# Patient Record
Sex: Male | Born: 1980
Health system: Southern US, Community
[De-identification: ages and names within clinical notes are randomized; demographics above are authoritative.]

## PROBLEM LIST (undated history)

## (undated) DIAGNOSIS — R0683 Snoring: Secondary | ICD-10-CM

## (undated) DIAGNOSIS — K219 Gastro-esophageal reflux disease without esophagitis: Secondary | ICD-10-CM

## (undated) DIAGNOSIS — I10 Essential (primary) hypertension: Secondary | ICD-10-CM

## (undated) DIAGNOSIS — M6282 Rhabdomyolysis: Secondary | ICD-10-CM

## (undated) DIAGNOSIS — R748 Abnormal levels of other serum enzymes: Secondary | ICD-10-CM

## (undated) HISTORY — PX: CYST EXCISION: SHX5701

## (undated) HISTORY — DX: Abnormal levels of other serum enzymes: R74.8

## (undated) HISTORY — PX: WISDOM TOOTH EXTRACTION: SHX21

## (undated) HISTORY — DX: Rhabdomyolysis: M62.82

## (undated) HISTORY — DX: Snoring: R06.83

## (undated) HISTORY — DX: Essential (primary) hypertension: I10

## (undated) HISTORY — PX: MUSCLE BIOPSY: SHX716

---
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
## 2001-07-26 ENCOUNTER — Encounter: Admission: RE | Admit: 2001-07-26 | Discharge: 2001-07-26 | Payer: Self-pay | Admitting: Family Medicine

## 2001-10-22 ENCOUNTER — Encounter: Admission: RE | Admit: 2001-10-22 | Discharge: 2001-10-22 | Payer: Self-pay | Admitting: Family Medicine

## 2002-02-21 DIAGNOSIS — M6282 Rhabdomyolysis: Secondary | ICD-10-CM

## 2002-02-21 HISTORY — DX: Rhabdomyolysis: M62.82

## 2002-03-19 ENCOUNTER — Encounter: Admission: RE | Admit: 2002-03-19 | Discharge: 2002-03-19 | Payer: Self-pay | Admitting: Family Medicine

## 2002-03-26 ENCOUNTER — Encounter: Admission: RE | Admit: 2002-03-26 | Discharge: 2002-03-26 | Payer: Self-pay | Admitting: Family Medicine

## 2002-04-03 ENCOUNTER — Encounter: Admission: RE | Admit: 2002-04-03 | Discharge: 2002-04-03 | Payer: Self-pay | Admitting: Family Medicine

## 2002-04-10 ENCOUNTER — Encounter: Admission: RE | Admit: 2002-04-10 | Discharge: 2002-04-10 | Payer: Self-pay | Admitting: Family Medicine

## 2004-07-05 ENCOUNTER — Ambulatory Visit: Payer: Self-pay | Admitting: Internal Medicine

## 2004-09-28 ENCOUNTER — Ambulatory Visit: Payer: Self-pay | Admitting: Internal Medicine

## 2004-10-03 ENCOUNTER — Ambulatory Visit: Payer: Self-pay | Admitting: Internal Medicine

## 2004-10-10 ENCOUNTER — Ambulatory Visit: Payer: Self-pay | Admitting: Internal Medicine

## 2005-07-19 ENCOUNTER — Ambulatory Visit: Payer: Self-pay | Admitting: Internal Medicine

## 2006-10-29 ENCOUNTER — Encounter: Payer: Self-pay | Admitting: Internal Medicine

## 2006-10-29 ENCOUNTER — Ambulatory Visit: Payer: Self-pay | Admitting: Internal Medicine

## 2006-10-29 DIAGNOSIS — R945 Abnormal results of liver function studies: Secondary | ICD-10-CM | POA: Insufficient documentation

## 2006-10-29 LAB — CONVERTED CEMR LAB
ALT: 30 units/L (ref 0–40)
Alkaline Phosphatase: 63 units/L (ref 39–117)
BUN: 10 mg/dL (ref 6–23)
Basophils Absolute: 0.2 10*3/uL — ABNORMAL HIGH (ref 0.0–0.1)
Basophils Relative: 2.6 % — ABNORMAL HIGH (ref 0.0–1.0)
CO2: 32 meq/L (ref 19–32)
Calcium: 9.7 mg/dL (ref 8.4–10.5)
Cholesterol: 185 mg/dL (ref 0–200)
Eosinophils Absolute: 0.1 10*3/uL (ref 0.0–0.6)
GFR calc Af Amer: 94 mL/min
GFR calc non Af Amer: 78 mL/min
HDL: 42.9 mg/dL (ref >39.0)
Hemoglobin: 15.1 g/dL (ref 13.0–17.0)
Lymphocytes Relative: 37 % (ref 12.0–46.0)
MCHC: 33.2 g/dL (ref 30.0–36.0)
MCV: 83.7 fL (ref 78.0–100.0)
Monocytes Absolute: 0.3 10*3/uL (ref 0.2–0.7)
Monocytes Relative: 4.4 % (ref 3.0–11.0)
Neutro Abs: 4.4 10*3/uL (ref 1.4–7.7)
Platelets: 249 10*3/uL (ref 150–400)
Potassium: 4 meq/L (ref 3.5–5.1)
TSH: 1.55 microintl units/mL (ref 0.35–5.50)
Total Protein: 7.7 g/dL (ref 6.0–8.3)
Triglycerides: 147 mg/dL (ref 0–149)
VLDL: 29 mg/dL (ref 0–40)

## 2006-11-26 ENCOUNTER — Ambulatory Visit: Payer: Self-pay | Admitting: Internal Medicine

## 2006-11-26 ENCOUNTER — Encounter: Payer: Self-pay | Admitting: Internal Medicine

## 2006-11-26 LAB — CONVERTED CEMR LAB: Total CK: 776 units/L (ref 7–195)

## 2007-01-14 ENCOUNTER — Ambulatory Visit: Payer: Self-pay | Admitting: Internal Medicine

## 2007-01-14 DIAGNOSIS — K648 Other hemorrhoids: Secondary | ICD-10-CM | POA: Insufficient documentation

## 2007-10-14 ENCOUNTER — Ambulatory Visit: Payer: Self-pay | Admitting: Internal Medicine

## 2007-10-28 ENCOUNTER — Ambulatory Visit: Payer: Self-pay | Admitting: Internal Medicine

## 2007-10-30 LAB — CONVERTED CEMR LAB
Calcium: 9.9 mg/dL (ref 8.4–10.5)
Chloride: 99 meq/L (ref 96–112)
Creatinine, Ser: 1.3 mg/dL (ref 0.4–1.5)
GFR calc non Af Amer: 70 mL/min
Sodium: 137 meq/L (ref 135–145)

## 2007-12-11 ENCOUNTER — Encounter (INDEPENDENT_AMBULATORY_CARE_PROVIDER_SITE_OTHER): Payer: Self-pay | Admitting: *Deleted

## 2008-03-16 ENCOUNTER — Encounter (INDEPENDENT_AMBULATORY_CARE_PROVIDER_SITE_OTHER): Payer: Self-pay | Admitting: *Deleted

## 2008-05-28 ENCOUNTER — Ambulatory Visit: Payer: Self-pay | Admitting: Family Medicine

## 2008-05-28 ENCOUNTER — Encounter (INDEPENDENT_AMBULATORY_CARE_PROVIDER_SITE_OTHER): Payer: Self-pay | Admitting: *Deleted

## 2008-05-28 LAB — CONVERTED CEMR LAB
Inflenza A Ag: POSITIVE
Influenza B Ag: POSITIVE

## 2009-08-18 ENCOUNTER — Ambulatory Visit: Payer: Self-pay | Admitting: Family

## 2009-08-23 ENCOUNTER — Ambulatory Visit: Payer: Self-pay | Admitting: Family

## 2009-08-23 LAB — CONVERTED CEMR LAB
ALT: 34 units/L (ref 0–53)
AST: 44 units/L — ABNORMAL HIGH (ref 0–37)
Alkaline Phosphatase: 51 units/L (ref 39–117)
Basophils Absolute: 0 10*3/uL (ref 0.0–0.1)
Bilirubin, Direct: 0 mg/dL (ref 0.0–0.3)
Chloride: 104 meq/L (ref 96–112)
Eosinophils Absolute: 0.1 10*3/uL (ref 0.0–0.7)
LDL Cholesterol: 99 mg/dL (ref 0–99)
Lymphocytes Relative: 39.6 % (ref 12.0–46.0)
MCHC: 33.1 g/dL (ref 30.0–36.0)
Monocytes Relative: 8.7 % (ref 3.0–12.0)
Neutrophils Relative %: 50.3 % (ref 43.0–77.0)
Potassium: 3.9 meq/L (ref 3.5–5.1)
RBC: 5.09 M/uL (ref 4.22–5.81)
RDW: 13.1 % (ref 11.5–14.6)
Sodium: 141 meq/L (ref 135–145)
Total Bilirubin: 0.6 mg/dL (ref 0.3–1.2)
Total CHOL/HDL Ratio: 4
Triglycerides: 153 mg/dL — ABNORMAL HIGH (ref 0.0–149.0)

## 2009-08-27 ENCOUNTER — Encounter: Payer: Self-pay | Admitting: Family

## 2009-09-15 ENCOUNTER — Ambulatory Visit: Payer: Self-pay | Admitting: Internal Medicine

## 2009-10-28 ENCOUNTER — Telehealth (INDEPENDENT_AMBULATORY_CARE_PROVIDER_SITE_OTHER): Payer: Self-pay | Admitting: *Deleted

## 2010-02-07 ENCOUNTER — Ambulatory Visit: Payer: Self-pay | Admitting: Internal Medicine

## 2010-02-08 LAB — CONVERTED CEMR LAB
Chloride: 105 meq/L (ref 96–112)
Creatinine, Ser: 1.3 mg/dL (ref 0.4–1.5)
Potassium: 4.4 meq/L (ref 3.5–5.1)
Sodium: 140 meq/L (ref 135–145)

## 2010-03-11 ENCOUNTER — Ambulatory Visit: Payer: Self-pay | Admitting: Pulmonary Disease

## 2010-03-11 DIAGNOSIS — R0683 Snoring: Secondary | ICD-10-CM | POA: Insufficient documentation

## 2010-04-10 ENCOUNTER — Encounter: Payer: Self-pay | Admitting: Pulmonary Disease

## 2010-04-10 ENCOUNTER — Ambulatory Visit (HOSPITAL_BASED_OUTPATIENT_CLINIC_OR_DEPARTMENT_OTHER): Admission: RE | Admit: 2010-04-10 | Discharge: 2010-04-10 | Payer: Self-pay | Admitting: Pulmonary Disease

## 2010-04-22 ENCOUNTER — Ambulatory Visit: Payer: Self-pay | Admitting: Pulmonary Disease

## 2010-08-23 ENCOUNTER — Ambulatory Visit
Admission: RE | Admit: 2010-08-23 | Discharge: 2010-08-23 | Payer: Self-pay | Source: Home / Self Care | Attending: Internal Medicine | Admitting: Internal Medicine

## 2010-08-23 ENCOUNTER — Encounter: Payer: Self-pay | Admitting: Internal Medicine

## 2010-08-23 NOTE — Miscellaneous (Signed)
Summary: npsg with AHI only 2.5/hr  Clinical Lists Changes  pt's npsg shows no sleep disordered breathing.  will call to inform him of results.  Appended Document: npsg with AHI only 2.5/hr please call pt and let him know that his sleep study does not show sleep apnea.  He does however need to work on weight loss or this can get worse.  Appended Document: npsg with AHI only 2.5/hr atc pt but no vm to leave a message. will attempt to call back later  Appended Document: npsg with AHI only 2.5/hr atc x2 no vm to leave message will try back again later  Appended Document: npsg with AHI only 2.5/hr called and spoke with pt and informed him with the results of his sleep study. Pt verbally stated he understood

## 2010-08-23 NOTE — Assessment & Plan Note (Signed)
Summary: RIGHT EAR PAIN//PH   Vital Signs:  Patient profile:   30 year old male Height:      70.25 inches Weight:      267 pounds BMI:     38.18 Temp:     98.7 degrees F oral BP sitting:   140 / 80  (left arm)  Vitals Entered By: Doristine Devoid (August 18, 2009 8:09 AM) CC: R ear discomfort    CC:  R ear discomfort .  History of Present Illness: [name] is a 30 year old male who presents today with complaint of R ear pain and difficulty hearing out of his right ear x 3 weeks.  Denies fever,  has used ear wax softening drops only.   Allergies: No Known Drug Allergies  Review of Systems       denies drainage from ear, denies fever.    Physical Exam  General:  Well-developed,well-nourished,in no acute distress; alert,appropriate and cooperative throughout examination Head:  Normocephalic and atraumatic without obvious abnormalities. No apparent alopecia or balding. Eyes:  PERRLA Ears:  Bilateral ear canals with impacted cerumen.  Cerumen removed with currette and flushing.  Bilateral TM's + erythema bulging right TM greater than the left.   Mouth:  Oral mucosa and oropharynx without lesions or exudates.  Teeth in good repair. Neck:  No deformities, masses, or tenderness noted. Lungs:  Normal respiratory effort, chest expands symmetrically. Lungs are clear to auscultation, no crackles or wheezes. Heart:  Normal rate and regular rhythm. S1 and S2 normal without gallop, murmur, click, rub or other extra sounds.   Impression & Recommendations:  Problem # 1:  OTITIS MEDIA, BILATERAL (ICD-382.9) Assessment New will place on amoxicillin His updated medication list for this problem includes:    Amoxicillin 500 Mg Cap (Amoxicillin) .Marland Kitchen... Take 1 capsule by mouth three times a day x 10 days  Problem # 2:  HYPERTENSION (ICD-401.9) Assessment: Deteriorated Notes non-compliant with meds, "thought my pressure was getting better"  Patient instructed to resume meds, arrange fasting  blood work and complete physical in the next 1-2 weeks.   His updated medication list for this problem includes:    Maxzide-25 37.5-25 Mg Tabs (Triamterene-hctz) .Marland Kitchen... 1/2 by mouth once daily  BP today: 140/80 Prior BP: 124/80 (05/28/2008)  Labs Reviewed: K+: 4.7 (10/28/2007) Creat: : 1.3 (10/28/2007)   Chol: 185 (10/29/2006)   HDL: 42.9 (10/29/2006)   LDL: 113 (10/29/2006)   TG: 147 (10/29/2006)  Problem # 3:  CERUMEN IMPACTION, BILATERAL (ICD-380.4) Assessment: New  S/P disimpaction with currette and flushing.  Orders: Cerumen Impaction Removal ([address])  Complete Medication List: 1)  Maxzide-25 37.5-25 Mg Tabs (Triamterene-hctz) .... 1/2 by mouth once daily 2)  Amoxicillin 500 Mg Cap (Amoxicillin) .... Take 1 capsule by mouth three times a day x 10 days  Patient Instructions: 1)  Please return next week fasting for the following labs- 2)  CBC, BMET, LFT's (v70) 3)  Complete antibiotics- call if your symptoms worsen or do not improve. 4)  Start taking your blood pressure medicines. 5)  Arrange a physical appointment in  1-2 weeks. Prescriptions: MAXZIDE-25 37.5-25 MG  TABS (TRIAMTERENE-HCTZ) 1/2 by mouth once daily  #30 Tablet x 0   Entered and Authorized by:   Lemont Fillers FNP   Signed by:   Lemont Fillers FNP on 08/18/2009   Method used:   [facility]        [facility] [phone][facility] (retail)  [address]       [place], [place]  [address]       Ph: [phone]       Fax: [phone]   RxID:   [id number] AMOXICILLIN 500 MG CAP (AMOXICILLIN) Take 1 capsule by mouth three times a day X 10 days  #30 x 0   Entered and Authorized by:   Lemont Fillers FNP   Signed by:   Lemont Fillers FNP on 08/18/2009   Method used:   [facility] to        [facility] [phone]* (retail)       [address]       [place], [place]  [address]       Ph: [phone]       Fax: [phone]    RxID:   [id number]

## 2010-08-23 NOTE — Assessment & Plan Note (Signed)
Summary: CPX/NS/KDC   Vital Signs:  Patient profile:   30 year old male Height:      70.25 inches Weight:      267.6 pounds BMI:     38.26 Pulse rate:   88 / minute BP sitting:   120 / 90  Vitals Entered By: Shary Decamp (September 15, 2009 1:39 PM) CC: cpx - not fasting Is Patient Diabetic? No   History of Present Illness: CPX  restarted BP meds last month, good medication compliance, no ambulatory BPs except x 1 time: 120/?  Preventive Screening-Counseling & Management  Alcohol-Tobacco     Alcohol type: weekends only  Caffeine-Diet-Exercise     Caffeine use/day: 1     Does Patient Exercise: no      Drug Use:  no.    Current Medications (verified): 1)  Maxzide-25 37.5-25 Mg  Tabs (Triamterene-Hctz) .... 1/2 By Mouth Once Daily  Allergies (verified): No Known Drug Allergies  Past History:  Past Medical History: Episode of rhabdomyolysis 8/03 (ck=20,000)-unclear, etiology Hypertension, dx aprox 2005   Past Surgical History: no  Family History: Reviewed history from 10/29/2006 and no changes required. MI-- F age 73 DM--no hypertension-- F M No history of colon or prostate cancer.  Social History: Reviewed history from 10/29/2006 and no changes required. 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 CV:  Denies chest pain or discomfort and swelling of feet. Resp:  Denies cough and shortness of breath. GI:  Denies bloody stools, diarrhea, nausea, and vomiting. GU:  Denies dysuria, hematuria, urinary frequency, and urinary hesitancy. Psych:  Denies anxiety and depression.  Physical Exam  General:  alert, well-developed, and overweight-appearing.   Neck:  no masses and no thyromegaly.   Lungs:  normal respiratory effort,  no intercostal retractions, no accessory muscle use, and normal breath sounds.   Heart:  normal rate, regular rhythm, no murmur, and no gallop.   Abdomen:  soft, non-tender, no distention, no masses, no guarding, and no rigidity.   Extremities:  no pretibial edema bilaterally  Psych:  Cognition and judgment appear intact. Alert and cooperative with normal attention span and concentration.  not anxious appearing and not depressed appearing.     Impression & Recommendations:  Problem # 1:  HEALTH MAINTENANCE EXAM (ICD-V70.0) Td 08 BMI discussed, diet! exercise! counseled  recent labs reviewed  Problem # 2:  HYPERTENSION (ICD-401.9) well controlled? see instructions  continue same meds  His updated medication list for this problem includes:    Maxzide-25 37.5-25 Mg Tabs (Triamterene-hctz) .Marland Kitchen... 1/2 by mouth once daily  BP today: 120/90 Prior BP: 140/80 (08/18/2009)  Labs Reviewed: K+: 3.9 (08/23/2009) Creat: : 1.4 (08/23/2009)   Chol: 172 (08/23/2009)   HDL: 42.10 (08/23/2009)   LDL: 99 (08/23/2009)   TG: 153.0 (08/23/2009)  Complete Medication List: 1)  Maxzide-25 37.5-25 Mg Tabs (Triamterene-hctz) .... 1/2 by mouth once daily  Patient Instructions: 1)  Check your blood pressure 2 or 3 times a week. If it is more than 140/80  consistently,please let us know  2)  Please schedule a follow-up appointment in 6 months .    Preventive Care Screening  Prior Values:    Last Tetanus Booster:  Tdap (10/29/2006)    Past Medical History:  Episode of rhabdomyolysis 8/03 (ck=20,000)-unclear, etiology    Hypertension, dx aprox 2005   Past Surgical History:    no    Risk Factors:  Tobacco use:  current    Year started:  smokes a cigar very seldom    Cigars:  Yes Drug use:  no Caffeine use:  1 drinks per day Alcohol use:  yes    Type:  weekends only Exercise:  no

## 2010-08-23 NOTE — Letter (Signed)
   [facility] [address] [place], [place] [address] [phone]    August 27, 2009   [facility] [address] [place], [place] [address]  RE:  LAB RESULTS  Dear  Mr. Quinto,  The following is an interpretation of your most recent lab tests.  Please take note of any instructions provided or changes to medications that have resulted from your lab work.  ELECTROLYTES:  Good - no changes needed  KIDNEY FUNCTION TESTS:  Good - no changes needed  LIPID PANEL:  Stable - no changes needed Triglyceride: 153.0   Cholesterol: 172   LDL: 99   HDL: 42.10   Chol/HDL%:  4   CBC:  Good - no changes needed   Sincerely Yours,    Lemont Fillers FNP

## 2010-08-23 NOTE — Assessment & Plan Note (Signed)
Summary: consult for possible osa   Copy to:  [facility] Primary Provider/Referring Provider:  Nolon Rod. Paz MD  CC:  Sleep Consult.  History of Present Illness: The pt is a 30y/o male who I have been asked to see for possible osa.  He has been noted to have loud snoring and an abnormal breathing pattern during sleep by his wife, and also describes choking arousals on rare occasions.  He goes to bed btw 11-12am, and arises at 7-8am to start his day.  He feels that he is rested upon arising.  The pt works as a Naval architect, and denies sleep pressure with periods of inactivity except with meetings.  He relates that he may doze in the evening with tv or movies, but denies any issues with sleepiness while driving.  His weight is up 10 pounds over the last 2 years, and his epworth score is 4.  Medications Prior to Update: 1)  Maxzide-25 37.5-25 Mg  Tabs (Triamterene-Hctz) .... 1/2 By Mouth Once Daily  Allergies (verified): No Known Drug Allergies  Past History:  Past Medical History: Reviewed history from 09/15/2009 and no changes required. Episode of rhabdomyolysis 8/03 (ck=20,000)-unclear, etiology Hypertension, dx aprox 2005   Past Surgical History: wisdom teeth extracted  Family History: Reviewed history from 09/15/2009 and no changes required. MI-- F age 53 DM--no hypertension-- F M No history of colon or prostate cancer.  Social History: Reviewed history from 09/15/2009 and no changes required. 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  ETOH-- weekends  diet-- "the best i can", on the road a lot  exercise-- once in a while   Review of Systems       The patient complains of shortness of breath with activity, shortness of breath at rest, acid heartburn, and headaches.  The patient denies productive cough, non-productive cough, coughing up blood, chest pain, irregular heartbeats, indigestion, loss of appetite, weight  change, abdominal pain, difficulty swallowing, sore throat, tooth/dental problems, nasal congestion/difficulty breathing through nose, sneezing, itching, ear ache, anxiety, depression, hand/feet swelling, joint stiffness or pain, rash, change in color of mucus, and fever.    Vital Signs:  Patient profile:   30 year old male Height:      70.25 inches Weight:      267.38 pounds BMI:     38.23 O2 Sat:      98 % on Room air Temp:     98.2 degrees F oral Pulse rate:   89 / minute BP sitting:   142 / 78  (left arm) Cuff size:   large  Vitals Entered By: Arman Filter LPN (March 11, 2010 11:26 AM)  O2 Flow:  Room air CC: Sleep Consult Comments Medications reviewed with patient Arman Filter LPN  March 11, 2010 11:32 AM    Physical Exam  General:  ow male in nad Eyes:  PERRLA and EOMI.   Nose:  turbinate hypertrophy bilat, mild septal deviation to left. Mouth:  mild elongation of soft palate and uvula significant narrowing posterior airway secondary to tonsillar hypertrophy and side wall tissue redundancy. Neck:  no jvd, tmg, LN Lungs:  clear to auscultation Heart:  rrr, no mrg Abdomen:  soft and nontender, bs+ Extremities:  no edema noted, pulses intact distally no cyanosis  Neurologic:  alert and oriented, moves all 4.   Impression & Recommendations:  Problem # 1:  SNORING (ICD-786.09) the pt's history is classic for sleep disordered breathing, but it  is unclear whether he has sleep apnea or just symptomatic snoring/upper airway resistance syndrome.  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  He also has underlying htn.  I would recommend a sleep study at the sleep center for documentation given his high risk occupation, and the requirement for DOT clearance.  I have also had a long discussion with the pt about sleep apnea, including its impact on QOL and CV health.  He will followup with me once results are available.  Other  Orders: Consultation Level IV (04540) Sleep Disorder Referral (Sleep Disorder)  Patient Instructions: 1)  will schedule for sleep study, and arrange followup once results are available. 2)  work on weight loss.

## 2010-08-23 NOTE — Assessment & Plan Note (Signed)
Summary: snoring/cbs   Vital Signs:  Patient profile:   30 year old male Weight:      264.25 pounds BMI:     37.78 Pulse rate:   86 / minute Pulse rhythm:   regular BP sitting:   136 / 80  (left arm) Cuff size:   large  Vitals Entered By: Army Fossa CMA (February 07, 2010 8:41 AM) CC: Pt here to discuss snoring problems. referral?   History of Present Illness: CC : snoring  loud snoring   x 6 months  sometimes wakes  up "choking", ?stop breathing at night   Allergies (verified): No Known Drug Allergies  Past History:  Past Medical History: Reviewed history from 09/15/2009 and no changes required. Episode of rhabdomyolysis 8/03 (ck=20,000)-unclear, etiology Hypertension, dx aprox 2005   Past Surgical History: Reviewed history from 09/15/2009 and no changes required. no  Social History: Reviewed history from 09/15/2009 and no changes required. 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   Mouth:  oropharynx moderately crowded Neck:  large neck Lungs:  normal respiratory effort, no intercostal retractions, no accessory muscle use, and normal breath sounds.   Heart:  normal rate, regular rhythm, no murmur, and no gallop.   Extremities:  no lower extremity edema   Impression & Recommendations:  Problem # 1:  ? 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 1/2 by mouth once daily  Orders: Venipuncture (84696) TLB-BMP (Basic Metabolic Panel-BMET) (80048-METABOL) Specimen Handling (29528)  BP today: 136/80 Prior BP: 120/90 (09/15/2009)  Labs Reviewed: K+: 3.9 (08/23/2009) Creat: : 1.4 (08/23/2009)   Chol: 172 (08/23/2009)   HDL: 42.10 (08/23/2009)   LDL: 99 (08/23/2009)   TG: 153.0 (08/23/2009)  Complete Medication List: 1)  Maxzide-25 37.5-25 Mg Tabs (Triamterene-hctz) .... 1/2 by mouth once daily  Patient Instructions: 1)  Will refer to to the doctors that rules out the sleep apnea 2)  Please schedule a follow-up appointment in 6 months , fasting, physical exam

## 2010-08-23 NOTE — Progress Notes (Signed)
  Phone Note Refill Request Message from:  Patient  Refills Requested: Medication #1:  MAXZIDE-25 37.5-25 MG  TABS 1/2 by mouth once daily. [facility]  Initial call taken by: Kandice Hams,  October 28, 2009 4:18 PM    Prescriptions: MAXZIDE-25 37.5-25 MG  TABS (TRIAMTERENE-HCTZ) 1/2 by mouth once daily  #30 Tablet x 2   Entered by:   Kandice Hams   Authorized by:   Nolon Rod. Paz MD   Signed by:   Kandice Hams on 10/28/2009   Method used:   Faxed to ...       [facility] [phone][facility] (retail)       [address]       [place], [place]  [address]       Ph: [phone]       Fax: [phone]   RxID:   [id number]

## 2010-08-31 NOTE — Letter (Signed)
Summary: Out of Work  [facility] [employer]  [address] [place], [place] [address]   Phone: [phone]  Fax: [phone]    August 23, 2010   Employee:  RINO HOSEA    To Whom It May Concern:   For Medical reasons, please excuse the above named employee from work for the following dates:  Start:   08/23/10  End:   08/25/10  The patient may return to work on 08/25/2010. If you need additional information, please feel free to contact our office.         Sincerely,    Willow Ora, MD

## 2010-08-31 NOTE — Assessment & Plan Note (Signed)
Summary: congested/cbs   Vital Signs:  Patient profile:   30 year old male Height:      70.25 inches Weight:      261 pounds O2 Sat:      96 % on Room air Temp:     98.8 degrees F oral Pulse rate:   109 / minute BP sitting:   118 / 72  (left arm)  Vitals Entered By: Jeremy Johann CMA (August 23, 2010 12:46 PM)  O2 Flow:  Room air CC: cough Comments -chest                     -pain                    -congestion -headache   History of Present Illness: symptoms started 3 days ago A low cough which is persistent and "violent" lately associated with anterior sharp chest pain. He will be asked to see in sputum early in the mornings otherwise the cough is dry. The sputum is green, this morning he saw you streaks of blood.  Current Medications (verified): 1)  Maxzide-25 37.5-25 Mg  Tabs (Triamterene-Hctz) .... 1/2 By Mouth Once Daily  Allergies (verified): No Known Drug Allergies  Past History:  Past Medical History: Reviewed history from 09/15/2009 and no changes required. Episode of rhabdomyolysis 8/03 (ck=20,000)-unclear, etiology Hypertension, dx aprox 2005   Past Surgical History: Reviewed history from 03/11/2010 and no changes required. wisdom teeth extracted  Social History: Reviewed history from 03/11/2010 and no changes required. 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  ETOH-- weekends  diet-- "the best i can", on the road a lot  exercise-- once in a while   Review of Systems General:  Denies chills and fever. ENT:  Denies sore throat; sinus congestion (-) . GI:  Denies nausea and vomiting; some diarrhea yesterday. MS:  Denies muscle aches.  Physical Exam  General:  alert and well-developed.  non-toxic Head:  face symmetric, nontender to palpation Ears:  R ear normal and L ear normal.   Nose:  not congested Mouth:  no redness or discharge Lungs:  normal respiratory effort, no  intercostal retractions, no accessory muscle use, and normal breath sounds.   Heart:  normal rate, regular rhythm, and no murmur.  I personally recheck his heart rate---- 100 Extremities:  no edema    Impression & Recommendations:  Problem # 1:  COUGH (ICD-786.2)  cough for 3 days with green sputum, no sinus symptoms, mild hemoptysis this morning? he is slightly tachycardic but not toxic Plan: Chest x-ray Antibiotics to be prescribed with results See  instructions  Orders: T-2 View CXR (71020TC)  Complete Medication List: 1)  Maxzide-25 37.5-25 Mg Tabs (Triamterene-hctz) .... 1/2 by mouth once daily 2)  Hydrocodone-homatropine 5-1.5 Mg Tabs (Hydrocodone-homatropine) .... One tsp every 4 hours as needed for cough  Patient Instructions: 1)  please get your chest x-ray 2)  Rest, fluids, Tylenol 3)  Mucinex DM twice a day until cough better 4)  is the cough is persistent or severe, you can use hydrocodone. it will make you  sleepy 5)  We'll call in antibiotics as soon as we get the chest x-ray report Prescriptions: HYDROCODONE-HOMATROPINE 5-1.5 MG TABS (HYDROCODONE-HOMATROPINE) one tsp every 4 hours as needed for cough  #150cc x 0   Entered and Authorized by:   Nolon Rod. Kerly Rigsbee MD   Signed by:   Nolon Rod. Benaiah Behan MD  on 08/23/2010   Method used:   Print then Give to Patient   RxID:   [id number]    Orders Added: 1)  T-2 View CXR [71020TC] 2)  Est. Patient Level III [56213]

## 2010-09-19 ENCOUNTER — Other Ambulatory Visit: Payer: Self-pay | Admitting: Internal Medicine

## 2010-09-19 ENCOUNTER — Encounter (INDEPENDENT_AMBULATORY_CARE_PROVIDER_SITE_OTHER): Payer: Managed Care, Other (non HMO) | Admitting: Internal Medicine

## 2010-09-19 ENCOUNTER — Encounter: Payer: Self-pay | Admitting: Internal Medicine

## 2010-09-19 DIAGNOSIS — Z0000 Encounter for general adult medical examination without abnormal findings: Secondary | ICD-10-CM

## 2010-09-19 DIAGNOSIS — IMO0001 Reserved for inherently not codable concepts without codable children: Secondary | ICD-10-CM | POA: Insufficient documentation

## 2010-09-19 DIAGNOSIS — L723 Sebaceous cyst: Secondary | ICD-10-CM | POA: Insufficient documentation

## 2010-09-19 LAB — BASIC METABOLIC PANEL
CO2: 29 mEq/L (ref 19–32)
Chloride: 103 mEq/L (ref 96–112)
Creatinine, Ser: 1.1 mg/dL (ref 0.4–1.5)
Potassium: 4.3 mEq/L (ref 3.5–5.1)
Sodium: 139 mEq/L (ref 135–145)

## 2010-09-19 LAB — CBC WITH DIFFERENTIAL/PLATELET
Basophils Relative: 0.3 % (ref 0.0–3.0)
Eosinophils Absolute: 0.1 10*3/uL (ref 0.0–0.7)
Eosinophils Relative: 1.3 % (ref 0.0–5.0)
HCT: 41.8 % (ref 39.0–52.0)
Hemoglobin: 14.2 g/dL (ref 13.0–17.0)
Lymphs Abs: 3.2 10*3/uL (ref 0.7–4.0)
MCHC: 34 g/dL (ref 30.0–36.0)
MCV: 85 fl (ref 78.0–100.0)
Monocytes Absolute: 0.5 10*3/uL (ref 0.1–1.0)
Neutro Abs: 4.2 10*3/uL (ref 1.4–7.7)
Neutrophils Relative %: 52.2 % (ref 43.0–77.0)
RBC: 4.92 Mil/uL (ref 4.22–5.81)
WBC: 8 10*3/uL (ref 4.5–10.5)

## 2010-09-19 LAB — LIPID PANEL
LDL Cholesterol: 84 mg/dL (ref 0–99)
Total CHOL/HDL Ratio: 4
Triglycerides: 169 mg/dL — ABNORMAL HIGH (ref 0.0–149.0)

## 2010-09-19 LAB — TSH: TSH: 1.26 u[IU]/mL (ref 0.35–5.50)

## 2010-09-19 LAB — ALT: ALT: 42 U/L (ref 0–53)

## 2010-09-19 LAB — AST: AST: 37 U/L (ref 0–37)

## 2010-09-22 ENCOUNTER — Encounter: Payer: Self-pay | Admitting: Internal Medicine

## 2010-09-29 NOTE — Assessment & Plan Note (Signed)
Summary: CPX/LAB/CBS/PH   Vital Signs:  Patient profile:   30 year old male Height:      70.25 inches Weight:      262.25 pounds Pulse rate:   78 / minute Pulse rhythm:   regular BP sitting:   126 / 82  (left arm) Cuff size:   large  Vitals Entered By: Army Fossa CMA (September 19, 2010 8:28 AM) CC: CPX, fasting  Comments c/o pain in (L) knee during activity [facility] [address]   History of Present Illness:  complete physical exam 4 days h/o L knee pain, no swelling, redness or specific injury, pain is anterior  occasionally has dyscomfort at a cyst in the back  Current Medications (verified): 1)  Maxzide-25 37.5-25 Mg  Tabs (Triamterene-Hctz) .... 1/2 By Mouth Once Daily  Allergies (verified): No Known Drug Allergies  Past History:  Past Medical History: Episode of rhabdomyolysis 8/03 (ck=20,000)-unclear, etiology Hypertension, dx aprox 2005  snoring--- sleep study  03-2010---->  negative for sleep apnea, Rx wt loss   Past Surgical History: Reviewed history from 03/11/2010 and no changes required. wisdom teeth extracted  Family History: Reviewed history from 09/15/2009 and no changes required. MI-- F age 13 DM--no hypertension-- F M No history of colon or prostate cancer.  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 nonsmoker @ present  ETOH-- weekends  diet-- avoiding fast food but still eats high in fat  exercise--very active t work  Review of Systems General:  Denies fatigue and fever. CV:  Denies chest pain or discomfort and swelling of feet. Resp:  Denies cough and shortness of breath. GI:  Denies bloody stools, diarrhea, and nausea. GU:  Denies hematuria, urinary frequency, and urinary hesitancy.  Physical Exam  General:  alert, well-developed, and overweight-appearing.   Neck:  no masses and no thyromegaly.   Lungs:  normal respiratory effort, no intercostal retractions,  no accessory muscle use, and normal breath sounds.   Heart:  normal rate, regular rhythm, and no murmur.   Abdomen:  soft, non-tender, no distention, no masses, no guarding, and no rigidity.   Extremities:  no pretibial edema bilaterally Knees symetric, normal to inpection, palpation and passive ROM except for high ryding knee cap Skin:  has a 4cm mass at mid upper back, half of it is fluctuant w/o redness or warmness  Psych:  Cognition and judgment appear intact. Alert and cooperative with normal attention span and concentration. No apparent delusions, illusions, hallucinations   Impression & Recommendations:  Problem # 1:  HEALTH MAINTENANCE EXAM (ICD-V70.0) Td 08 wt discussed , diet! exercise! counseled  labs  STE  Orders: TLB-BMP (Basic Metabolic Panel-BMET) (80048-METABOL) TLB-CBC Platelet - w/Differential (85025-CBCD) TLB-Lipid Panel (80061-LIPID) TLB-TSH (Thyroid Stimulating Hormone) (84443-TSH) TLB-ALT (SGPT) (84460-ALT) TLB-AST (SGOT) (84450-SGOT) Specimen Handling (04540)  Problem # 2:  SEBACEOUS CYST (ICD-706.2)   see physical exam, surgical referal   Orders: Surgical Referral (Surgery)  Problem # 3:   knee pain  exam essentially negative, he does have a high riding kneecap.   Rx  stretching  , if no  better will refer to orthopedic surgery  Problem # 4:  MYALGIA (ICD-729.1)   history of elevated CKs before. Labs  Orders: TLB-Sedimentation Rate (ESR) (85652-ESR) TLB-CK Total Only(Creatine Kinase/CPK) (82550-CK)  Complete Medication List: 1)  Maxzide-25 37.5-25 Mg Tabs (Triamterene-hctz) .... 1/2 by mouth once daily  Other Orders: Venipuncture (98119)  Patient Instructions: 1)  Please schedule a follow-up  appointment in 6 months .  Prescriptions: MAXZIDE-25 37.5-25 MG  TABS (TRIAMTERENE-HCTZ) 1/2 by mouth once daily  #30 x 6   Entered by:   Army Fossa CMA   Authorized by:   Nolon Rod. Jaimee Corum MD   Signed by:   Army Fossa CMA on 09/19/2010    Method used:   [facility] to        [phone] (retail)       [address]       [place], [place]  [address]       Ph: [phone]       Fax: [phone]   RxID:   [id number]    Orders Added: 1)  Venipuncture [72536] 2)  TLB-BMP (Basic Metabolic Panel-BMET) [80048-METABOL] 3)  TLB-CBC Platelet - w/Differential [85025-CBCD] 4)  TLB-Lipid Panel [80061-LIPID] 5)  TLB-TSH (Thyroid Stimulating Hormone) [84443-TSH] 6)  TLB-Sedimentation Rate (ESR) [85652-ESR] 7)  TLB-ALT (SGPT) [84460-ALT] 8)  TLB-AST (SGOT) [84450-SGOT] 9)  TLB-CK Total Only(Creatine Kinase/CPK) [82550-CK] 10)  Specimen Handling [99000] 11)  Surgical Referral [Surgery] 12)  Est. Patient age 2-39 [phone]

## 2010-09-29 NOTE — Letter (Signed)
Summary: neurology referal  [facility] [place]  [address] [place], [place] [address]   Phone: [phone]  Fax: [phone]    09/22/2010 Neurology Thank you in advance for agreeing to see my patient:  Benjamin Warner [address], [place]  [address]  Phone: [phone]  Reason for Referral:  30 y/o male w/ h/o rhabdomyolisis (see below) , currently asx found to have chronically elevated CK. Please advise if further w/u is needed    Current Medications: 1)  MAXZIDE-25 37.5-25 MG  TABS (TRIAMTERENE-HCTZ) 1/2 by mouth once daily   Past Medical History: 1)  Episode of rhabdomyolysis 8/03 (ck=20,000)-unclear, etiology 2)  Hypertension, dx aprox 2005  3)  snoring--- sleep study  03-2010---->  negative for sleep apnea, Rx wt loss       Pertinent Labs: will enclose all recent labs    Thank you again for agreeing to see our patient; please contact us if you have any further questions or need additional information.  Sincerely,  Jose E. Paz MD

## 2010-10-04 ENCOUNTER — Encounter (HOSPITAL_BASED_OUTPATIENT_CLINIC_OR_DEPARTMENT_OTHER)
Admission: RE | Admit: 2010-10-04 | Discharge: 2010-10-04 | Disposition: A | Discharge status: Home / Self Care | Payer: Managed Care, Other (non HMO) | Source: Ambulatory Visit | Attending: General Surgery | Admitting: General Surgery

## 2010-10-04 DIAGNOSIS — Z01810 Encounter for preprocedural cardiovascular examination: Secondary | ICD-10-CM | POA: Insufficient documentation

## 2010-10-04 DIAGNOSIS — Z01812 Encounter for preprocedural laboratory examination: Secondary | ICD-10-CM | POA: Insufficient documentation

## 2010-10-04 LAB — BASIC METABOLIC PANEL
BUN: 12 mg/dL (ref 6–23)
CO2: 29 mEq/L (ref 19–32)
Calcium: 9.6 mg/dL (ref 8.4–10.5)
Glucose, Bld: 94 mg/dL (ref 70–99)
Sodium: 137 mEq/L (ref 135–145)

## 2010-10-06 ENCOUNTER — Ambulatory Visit (HOSPITAL_BASED_OUTPATIENT_CLINIC_OR_DEPARTMENT_OTHER)
Admission: RE | Admit: 2010-10-06 | Discharge: 2010-10-06 | Disposition: A | Discharge status: Home / Self Care | Payer: Managed Care, Other (non HMO) | Source: Ambulatory Visit | Attending: General Surgery | Admitting: General Surgery

## 2010-10-06 DIAGNOSIS — L723 Sebaceous cyst: Secondary | ICD-10-CM | POA: Insufficient documentation

## 2010-10-06 DIAGNOSIS — Z01812 Encounter for preprocedural laboratory examination: Secondary | ICD-10-CM | POA: Insufficient documentation

## 2010-10-06 LAB — POCT HEMOGLOBIN-HEMACUE: Hemoglobin: 15.4 g/dL (ref 13.0–17.0)

## 2010-10-27 NOTE — Op Note (Signed)
NAMEAMEDEO, DETWEILER NO.:  [id number]  MEDICAL RECORD NO.:  [id number]           PATIENT TYPE:  LOCATION:                                 FACILITY:  PHYSICIAN:  Mary Sella. Andrey Campanile, MD     DATE OF BIRTH:  1980/08/03  DATE OF PROCEDURE:  10/06/2010 DATE OF DISCHARGE:                              OPERATIVE REPORT   PREOPERATIVE DIAGNOSIS:  Right upper back and left lower back epidermoid inclusion cyst.  POSTOPERATIVE DIAGNOSIS:  Right upper back and left lower back epidermoid inclusion cyst.  PROCEDURE:  Excision of right upper back and left lower back epidermoid inclusion cyst.  SURGEON:  Mary Sella. Andrey Campanile, MD  ANESTHESIA:  Monitored anesthesia care plus 40 mL of 0.25% Marcaine with epi.  FINDINGS:  The left lower back epidermoid inclusion cyst was about 1 cm x 1 cm.  That wound was closed in 2 layers.  The right upper back epidermoid inclusion cyst was much larger and measured 6 x 5 cm grossly. I made a 6-cm overlying incision and that wound was closed in 1 layer with nylon.  ESTIMATED BLOOD LOSS:  Minimal.  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  He desires surgical excision.  We discussed the risks and benefits of surgery including bleeding, infection, injury to surrounding structure, hematoma formation, seroma formation, wound complications, DVT occurrence, and possible cyst recurrence.  He elects to proceed to surgery.  DESCRIPTION OF PROCEDURE:  After obtaining informed consent, the patient was brought back to the operating room and placed supine on the operating room table.  Monitored anesthesia care was established.  He received antibiotics prior to skin incision.  This was done at [facility].  Sequential  compression devices were placed. He was then placed in the right lateral decubitus position with his left side up.  His back was prepped and draped with ChloraPrep.  I first began with the left lower back epidermoid inclusion cyst.  It was in his left lower back and measured about 1 cm x 1 cm.  Local was infiltrated. I then made an elliptical incision incorporating the punctum.  Then using tenotomy scissors the cyst in its entirety including the wall was excised.  It was about the size of an English P.  The wound was irrigated.  I then placed a 3-0 Vicryl in the deep dermis and then closed the skin with a 4-0 Monocryl in a subcuticular fashion.  I then turned my attention to the right upper back lesion.  It was more of an oblique orientation in the shape of a dumbbell slightly to the right of the spine in the low thoracic region.  Using a pen, I outlined the shape of the cyst which was again in the shape of a dumbbell.  I then drew an oblique incision overlying the midportion incorporating the punctum.  I then infiltrated local in  the regional fashion.  I then incised the skin with a #15 blade and 6 cm in length.  I then divided the deep dermis with Bovie electrocautery using the cut feature.  Then using skin hooks, I raised skin flaps away from the cyst wall.  The cyst wall was entered with some extrusion of the sevum.  The cyst was actually quite deep and went down to the fascia.  I circumferentially dissected this cyst in its entirety away from the surrounding tissue.  This was done in a circumferential pattern.  I ended up incising the cyst in its entirety. It left somewhat of a pocket.  The wound was copiously irrigated. Hemostasis was achieved.  I infiltrated local in the fascia.  I then using electrocautery separated some of the deep fat away from the fascia both on the superior and inferior aspects of wound to help skin closure. I ended up just closing this incision with  interrupted 3-0 nylon sutures approximately 8 stitches.  We then placed antibiotic ointment and 4 x 4 over the left lower back wound.  We placed benzoin, Steri-Strips, and occlusive bandage.  The patient tolerated the procedure well.  There were no immediate complications.  The patient was taken to the recovery room in stable addition.  All needle, instrument, and sponge counts were correct x2.     Mary Sella. Andrey Campanile, MD     EMW/MEDQ  D:  10/06/2010  T:  10/07/2010  Job:  [id number]  cc:   Willow Ora, MD  Electronically Signed by Gaynelle Adu M.D. on 10/27/2010 07:40:19 AM

## 2010-11-25 ENCOUNTER — Encounter (INDEPENDENT_AMBULATORY_CARE_PROVIDER_SITE_OTHER): Payer: Self-pay | Admitting: General Surgery

## 2010-12-27 ENCOUNTER — Encounter: Payer: Self-pay | Admitting: Internal Medicine

## 2010-12-28 ENCOUNTER — Other Ambulatory Visit (HOSPITAL_COMMUNITY): Payer: Self-pay | Admitting: Neurology

## 2010-12-28 DIAGNOSIS — R748 Abnormal levels of other serum enzymes: Secondary | ICD-10-CM

## 2010-12-29 ENCOUNTER — Ambulatory Visit (HOSPITAL_BASED_OUTPATIENT_CLINIC_OR_DEPARTMENT_OTHER): Payer: BC Managed Care – PPO | Admitting: Radiology

## 2010-12-29 ENCOUNTER — Ambulatory Visit (INDEPENDENT_AMBULATORY_CARE_PROVIDER_SITE_OTHER): Payer: BC Managed Care – PPO

## 2010-12-29 ENCOUNTER — Encounter (HOSPITAL_COMMUNITY)
Admission: RE | Admit: 2010-12-29 | Discharge: 2010-12-29 | Disposition: A | Discharge status: Home / Self Care | Payer: BC Managed Care – PPO | Source: Ambulatory Visit | Attending: Neurosurgery | Admitting: Neurosurgery

## 2010-12-29 DIAGNOSIS — Z01818 Encounter for other preprocedural examination: Secondary | ICD-10-CM

## 2010-12-29 DIAGNOSIS — Z8249 Family history of ischemic heart disease and other diseases of the circulatory system: Secondary | ICD-10-CM

## 2010-12-29 DIAGNOSIS — R748 Abnormal levels of other serum enzymes: Secondary | ICD-10-CM

## 2010-12-29 LAB — CBC
HCT: 42.6 % (ref 39.0–52.0)
Hemoglobin: 14.3 g/dL (ref 13.0–17.0)
MCH: 27.7 pg (ref 26.0–34.0)
MCHC: 33.6 g/dL (ref 30.0–36.0)
MCV: 82.6 fL (ref 78.0–100.0)
Platelets: 202 10*3/uL (ref 150–400)
RBC: 5.16 MIL/uL (ref 4.22–5.81)
RDW: 14 % (ref 11.5–15.5)
WBC: 8.3 10*3/uL (ref 4.0–10.5)

## 2010-12-29 LAB — DIFFERENTIAL
Basophils Absolute: 0 10*3/uL (ref 0.0–0.1)
Basophils Relative: 0 % (ref 0–1)
Eosinophils Absolute: 0.1 10*3/uL (ref 0.0–0.7)
Eosinophils Relative: 1 % (ref 0–5)
Lymphocytes Relative: 42 % (ref 12–46)
Lymphs Abs: 3.5 10*3/uL (ref 0.7–4.0)
Monocytes Absolute: 0.6 10*3/uL (ref 0.1–1.0)
Monocytes Relative: 8 % (ref 3–12)
Neutro Abs: 4.1 10*3/uL (ref 1.7–7.7)
Neutrophils Relative %: 50 % (ref 43–77)

## 2010-12-29 LAB — BASIC METABOLIC PANEL
BUN: 17 mg/dL (ref 6–23)
CO2: 30 mEq/L (ref 19–32)
Calcium: 9.7 mg/dL (ref 8.4–10.5)
Chloride: 103 mEq/L (ref 96–112)
Creatinine, Ser: 1.22 mg/dL (ref 0.4–1.5)
GFR calc Af Amer: >60 mL/min (ref >60)
GFR calc non Af Amer: >60 mL/min (ref >60)
Glucose, Bld: 100 mg/dL — ABNORMAL HIGH (ref 70–99)
Potassium: 3.9 mEq/L (ref 3.5–5.1)
Sodium: 141 mEq/L (ref 135–145)

## 2010-12-29 LAB — SURGICAL PCR SCREEN
MRSA, PCR: NEGATIVE
Staphylococcus aureus: POSITIVE — AB

## 2010-12-29 NOTE — Progress Notes (Signed)
Pt. In for and 12 Lead EKG referred per Dr. Drue Novel MD. Patient states the EKG is done as a pre-op for muscle Biopsy. EKG interpretation per Dr. Eden Emms MD . The original EKG given to pt. Copy placed in the reading room.

## 2010-12-30 ENCOUNTER — Encounter (HOSPITAL_COMMUNITY): Payer: Self-pay | Admitting: Neurology

## 2011-01-02 ENCOUNTER — Observation Stay (HOSPITAL_COMMUNITY)
Admission: RE | Admit: 2011-01-02 | Discharge: 2011-01-02 | Disposition: A | Discharge status: Home / Self Care | Payer: BC Managed Care – PPO | Source: Ambulatory Visit | Attending: Neurosurgery | Admitting: Neurosurgery

## 2011-01-02 ENCOUNTER — Other Ambulatory Visit: Payer: Self-pay | Admitting: Neurosurgery

## 2011-01-02 DIAGNOSIS — Z01812 Encounter for preprocedural laboratory examination: Secondary | ICD-10-CM | POA: Insufficient documentation

## 2011-01-02 DIAGNOSIS — M6282 Rhabdomyolysis: Principal | ICD-10-CM | POA: Insufficient documentation

## 2011-01-04 NOTE — Op Note (Signed)
  NAMEAERON, DONAGHEY NO.:  [id number]  MEDICAL RECORD NO.:  [id number]  LOCATION:  3528                         FACILITY:  [facility]  PHYSICIAN:  Hewitt Shorts, M.D.DATE OF BIRTH:  07-Jul-1981  DATE OF PROCEDURE:  01/02/2011 DATE OF DISCHARGE:  01/02/2011                              OPERATIVE REPORT   PREOPERATIVE DIAGNOSIS:  Rhabdomyolysis with exercise.  POSTOPERATIVE DIAGNOSIS:  Rhabdomyolysis with exercise.  PROCEDURE:  Left vastus lateralis muscle biopsy.  SURGEON:  Hewitt Shorts, MD  ANESTHESIA:  General endotracheal.  INDICATION:  The patient is a 30 year old man whom Dr. Levert Feinstein requested a left vastus lateralis muscle biopsy.  He has a history of rhabdomyolysis with exercise and he has requested muscle biopsy to assist with diagnostic workup.  We have requested light microscopy, electron microscopy, and metabolic panel and urine sample was obtained at the request of the pathology department to accompany the muscle biopsy.  PROCEDURE:  The patient was brought to the operating room and placed under general anesthesia.  The left thigh was prepped with Betadine soap and solution, draped in a sterile fashion and a vertical incision was made in the anterolateral distal thigh.  The line of the incision was infiltrated with local anesthetic with epinephrine.  An incision was made and carried down through the subcutaneous tissue to the fascia overlying the vastus lateralis muscle.  The fascia was opened and we dissected the muscle fibers from the connective tissue and we measured out the length of little over 2.5 cm and obtained 3 strips of muscle at least 0.5 cm in diameter and 2.5 cm in length.  They were secured to tongue blades with 0-silk ties and wrapped with gauze sponge dampened with saline and they were sent to pathology for evaluation.  We then proceeded with closing the fascia overlying the muscle with interrupted 2-0 undyed Vicryl  sutures.  The Scarpa fascia was closed with interrupted undyed 2-0 Vicryl sutures and the subcutaneous/subcuticular layer were closed with interrupted inverted 2-0 and 3-0 undyed Vicryl sutures.  Skin was closed with Dermabond.  Procedure was tolerated well. Prior to emergence, the patient's bladder was catheterized with an in- and-out catheterization and a urine sample sent along with the muscle biopsy to Pathology.  Following surgery, the patient was reversed from the anesthetic and transferred to the recovery room for further care. Estimated blood loss was nil.  Sponge and needle count was correct.    Hewitt Shorts, M.D.    RWN/MEDQ  D:  01/02/2011  T:  01/03/2011  Job:  [id number]  Electronically Signed by Shirlean Kelly M.D. on 01/04/2011 10:47:48 AM

## 2011-01-06 NOTE — Discharge Summary (Signed)
  NAMELOLA, LOFARO NO.:  [id number]  MEDICAL RECORD NO.:  [id number]  LOCATION:  3528                         FACILITY:  [facility]  PHYSICIAN:  Hewitt Shorts, M.D.DATE OF BIRTH:  12-03-1980  DATE OF ADMISSION:  01/02/2011 DATE OF DISCHARGE:  01/02/2011                              DISCHARGE SUMMARY   HISTORY OF PRESENT ILLNESS:  The patient is a 30 year old man who was brought to [facility] for an outpatient muscle biopsy, which have been requested by Dr. Terrace Arabia.  He had a history of rhabdomyolysis with exercise.  General examination was unremarkable.  Neurologic examination was unremarkable.  HOSPITAL COURSE:  The patient underwent a left vastus lateralis muscle biopsy.  It was uneventful.  He went to the recovery room and then to 3500 unit for observation and then was released to home.  Discharge prescription was given for Vicodin.  He is to follow up with me in 1 week for wound check and with Dr. Terrace Arabia to review the pathology report.  DISCHARGE DIAGNOSIS:  Rhabdomyolysis with exercise.     Hewitt Shorts, M.D.     RWN/MEDQ  D:  01/04/2011  T:  01/05/2011  Job:  [id number]  Electronically Signed by Shirlean Kelly M.D. on 01/06/2011 07:20:57 AM

## 2011-03-20 ENCOUNTER — Ambulatory Visit: Payer: Managed Care, Other (non HMO) | Admitting: Internal Medicine

## 2011-04-03 ENCOUNTER — Ambulatory Visit: Payer: Managed Care, Other (non HMO) | Admitting: Internal Medicine

## 2011-04-03 DIAGNOSIS — Z0289 Encounter for other administrative examinations: Secondary | ICD-10-CM

## 2011-04-05 ENCOUNTER — Encounter (INDEPENDENT_AMBULATORY_CARE_PROVIDER_SITE_OTHER): Payer: Self-pay | Admitting: General Surgery

## 2011-04-10 ENCOUNTER — Ambulatory Visit: Payer: BC Managed Care – PPO | Admitting: Internal Medicine

## 2011-04-12 ENCOUNTER — Encounter: Payer: Self-pay | Admitting: Internal Medicine

## 2011-04-17 ENCOUNTER — Ambulatory Visit (INDEPENDENT_AMBULATORY_CARE_PROVIDER_SITE_OTHER): Payer: BC Managed Care – PPO | Admitting: Internal Medicine

## 2011-04-17 ENCOUNTER — Encounter: Payer: Self-pay | Admitting: Internal Medicine

## 2011-04-17 DIAGNOSIS — L723 Sebaceous cyst: Secondary | ICD-10-CM

## 2011-04-17 DIAGNOSIS — R748 Abnormal levels of other serum enzymes: Secondary | ICD-10-CM

## 2011-04-17 DIAGNOSIS — M549 Dorsalgia, unspecified: Secondary | ICD-10-CM

## 2011-04-17 NOTE — Assessment & Plan Note (Addendum)
Status post excision per surgery

## 2011-04-17 NOTE — Assessment & Plan Note (Signed)
Not radicular symptoms, symptoms mild. Recommend motrin, will  call if symptoms increase. Flexeril?

## 2011-04-17 NOTE — Assessment & Plan Note (Addendum)
Meaning of elevated CK discussed with the patient, Status post muscular biopsy, negative. Plan to recheck from time to time

## 2011-04-17 NOTE — Progress Notes (Signed)
  Subjective:    Patient ID: Benjamin Warner, male    DOB: 05-26-1981, 30 y.o.   MRN: [id number]  HPI Once a week he experienced moderate low back pain without radiation, pain is mostly when he is active but sometimes happens at rest. Not taking any medication for pain. Sometimes he feels that he can't breathe when he is having pain.  Past Medical History  Diagnosis Date  . Rhabdomyolysis 8/03    ck=20,000-unclear etiology  . Hypertension approx 2005  . Elevated CK     chronic, ~ 500, saw neuro, muscle Bx 12-2010: negative    Past Surgical History  Procedure Date  . Wisdom tooth extraction      Review of Systems Patient  Does a lot of  lifting at work but uses appropriate techniques. History of elevated CKs, patient is asymptomatic, saw neurology, they ordered  a muscle biopsy, report reviewed, normal.     Objective:   Physical Exam  Constitutional: He is oriented to person, place, and time. He appears well-developed.  Musculoskeletal: He exhibits no edema.       Not tender to palpation in the lower back   Neurological: He is alert and oriented to person, place, and time.       Motor exam symmetric, DTRs symmetric, straight leg test negative  Psychiatric: He has a normal mood and affect. His behavior is normal. Judgment and thought content normal.          Assessment & Plan:

## 2011-10-04 ENCOUNTER — Telehealth: Payer: Self-pay | Admitting: Internal Medicine

## 2011-10-04 MED ORDER — TRIAMTERENE-HCTZ 37.5-25 MG PO TABS: 1.0000 | ORAL_TABLET | Freq: Every day | ORAL | Status: DC

## 2011-10-04 NOTE — Telephone Encounter (Signed)
Refill done.  

## 2011-10-04 NOTE — Telephone Encounter (Signed)
Okay #90, no refills 

## 2011-10-04 NOTE — Telephone Encounter (Signed)
Refill: Triam/HCTZ 37.5/25 mg tab. Request for 90 day supply.

## 2011-10-04 NOTE — Telephone Encounter (Signed)
OK to refill

## 2011-10-12 ENCOUNTER — Other Ambulatory Visit: Payer: Self-pay | Admitting: Internal Medicine

## 2011-10-12 NOTE — Telephone Encounter (Signed)
Spoke with pharmacy & the rx was refilled on 3.15.13.

## 2011-10-16 ENCOUNTER — Encounter: Payer: BC Managed Care – PPO | Admitting: Internal Medicine

## 2011-11-10 ENCOUNTER — Ambulatory Visit (INDEPENDENT_AMBULATORY_CARE_PROVIDER_SITE_OTHER): Payer: BC Managed Care – PPO | Admitting: Internal Medicine

## 2011-11-10 ENCOUNTER — Encounter: Payer: Self-pay | Admitting: Internal Medicine

## 2011-11-10 DIAGNOSIS — Z0000 Encounter for general adult medical examination without abnormal findings: Secondary | ICD-10-CM | POA: Insufficient documentation

## 2011-11-10 DIAGNOSIS — R748 Abnormal levels of other serum enzymes: Secondary | ICD-10-CM

## 2011-11-10 DIAGNOSIS — M25569 Pain in unspecified knee: Secondary | ICD-10-CM

## 2011-11-10 DIAGNOSIS — I10 Essential (primary) hypertension: Secondary | ICD-10-CM

## 2011-11-10 LAB — COMPREHENSIVE METABOLIC PANEL
Albumin: 4.6 g/dL (ref 3.5–5.2)
Alkaline Phosphatase: 56 U/L (ref 39–117)
CO2: 26 mEq/L (ref 19–32)
GFR: 97.17 mL/min (ref >60.00)
Glucose, Bld: 92 mg/dL (ref 70–99)
Potassium: 4.5 mEq/L (ref 3.5–5.1)
Sodium: 141 mEq/L (ref 135–145)
Total Protein: 8 g/dL (ref 6.0–8.3)

## 2011-11-10 LAB — CBC WITH DIFFERENTIAL/PLATELET
Basophils Absolute: 0 10*3/uL (ref 0.0–0.1)
Eosinophils Relative: 0.8 % (ref 0.0–5.0)
MCV: 85.5 fl (ref 78.0–100.0)
Monocytes Absolute: 0.6 10*3/uL (ref 0.1–1.0)
Neutrophils Relative %: 54.9 % (ref 43.0–77.0)
Platelets: 210 10*3/uL (ref 150.0–400.0)
RDW: 14.6 % (ref 11.5–14.6)
WBC: 7.4 10*3/uL (ref 4.5–10.5)

## 2011-11-10 LAB — LIPID PANEL
Cholesterol: 200 mg/dL (ref 0–200)
Total CHOL/HDL Ratio: 4
Triglycerides: 235 mg/dL — ABNORMAL HIGH (ref 0.0–149.0)

## 2011-11-10 NOTE — Assessment & Plan Note (Signed)
Previous workup negative, labs

## 2011-11-10 NOTE — Assessment & Plan Note (Addendum)
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 He is snoring more likely from increased wt, noting that previously a sleep study was negative. labs  STE

## 2011-11-10 NOTE — Assessment & Plan Note (Signed)
Ongoing issue, refer to sports med

## 2011-11-10 NOTE — Assessment & Plan Note (Signed)
Well controlled Reports normal amb BPs

## 2011-11-10 NOTE — Progress Notes (Signed)
  Subjective:    Patient ID: Benjamin Warner, male    DOB: Nov 21, 1980, 31 y.o.   MRN: [id number]  HPI CPX  Past Medical History: Episode of rhabdomyolysis 8/03 (ck=20,000)-unclear, etiology Hypertension, dx aprox 2005  snoring--- sleep study  03-2010---->  negative for sleep apnea, Rx wt loss   Past Surgical History: wisdom teeth extracted 2012---Excision of right upper back and left lower back epidermoid inclusion cyst. Family History: MI-- F age 48 Stroke--no DM--no hypertension-- F  M No history of colon or prostate cancer.  Social History: Occupation:truck driver Married, one child, one on the way tobacco--smoked "socially" as a teenager x 2 to 3 years. nonsmoker @ present  ETOH-- weekends  diet--ate poorly , just switching to a healthier diet, he did reach 280 pounds  exercise--very active at work   Review of Systems  Constitutional: Negative for fever.  Respiratory: Negative for shortness of breath and wheezing.        Had cough x 3  Months, sx resolved 3 weeks ago  Cardiovascular: Negative for chest pain and leg swelling.  Gastrointestinal: Negative for abdominal pain and blood in stool.  Genitourinary: Negative for dysuria, hematuria and difficulty urinating.  Psychiatric/Behavioral:       No depression or anxiety       Objective:   Physical Exam  General:  alert, well-developed, and overweight-appearing.   Neck:  no masses and no thyromegaly.   Lungs:  normal respiratory effort, no intercostal retractions, no accessory muscle use, and normal breath sounds.   Heart:  normal rate, regular rhythm, and no murmur.   Abdomen:  soft, non-tender, no distention, no masses, no guarding, and no rigidity.   Extremities:  no pretibial edema bilaterally Knees-- no effusion or deformity  Psych:  Cognition and judgment appear intact. Alert and cooperative with normal attention span and concentration. No apparent delusions, illusions, hallucinations    Assessment & Plan:

## 2011-11-15 ENCOUNTER — Encounter: Payer: Self-pay | Admitting: Internal Medicine

## 2011-11-16 ENCOUNTER — Ambulatory Visit: Payer: BC Managed Care – PPO | Admitting: Family Medicine

## 2011-11-27 ENCOUNTER — Encounter: Payer: Self-pay | Admitting: Family Medicine

## 2011-11-27 ENCOUNTER — Ambulatory Visit (HOSPITAL_BASED_OUTPATIENT_CLINIC_OR_DEPARTMENT_OTHER)
Admission: RE | Admit: 2011-11-27 | Discharge: 2011-11-27 | Disposition: A | Discharge status: Home / Self Care | Payer: BC Managed Care – PPO | Source: Ambulatory Visit | Attending: Family Medicine | Admitting: Family Medicine

## 2011-11-27 ENCOUNTER — Ambulatory Visit (INDEPENDENT_AMBULATORY_CARE_PROVIDER_SITE_OTHER): Payer: BC Managed Care – PPO | Admitting: Family Medicine

## 2011-11-27 VITALS — BP 142/73 | HR 88 | Temp 97.6°F | Ht 70.0 in | Wt 260.0 lb

## 2011-11-27 DIAGNOSIS — M25562 Pain in left knee: Secondary | ICD-10-CM

## 2011-11-27 DIAGNOSIS — M25569 Pain in unspecified knee: Secondary | ICD-10-CM | POA: Insufficient documentation

## 2011-11-27 DIAGNOSIS — M25561 Pain in right knee: Secondary | ICD-10-CM

## 2011-11-27 NOTE — Progress Notes (Signed)
Subjective:    Patient ID: Benjamin Warner, male    DOB: 04-15-1981, 31 y.o.   MRN: [id number]  PCP: Dr. Drue Novel  HPI 31 yo M here for L > R knee pain.  Patient denies known injury. 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 Pain worse with climbing also and squatting. No catching, locking though feels like it's buckling occasionally. Some swelling into ankle but none in knees. Feels like kneecap is going to pop out at times. Played a lot of sports growing up. H/o what he describes as osgood-schlatters in past. No prior knee injuries.  Past Medical History  Diagnosis Date  . Rhabdomyolysis 8/03    ck=20,000-unclear etiology  . Hypertension approx 2005  . Elevated CK     chronic, ~ 500, saw neuro, muscle Bx 12-2010: negative     Current Outpatient Prescriptions on File Prior to Visit  Medication Sig Dispense Refill  . triamterene-hydrochlorothiazide (MAXZIDE-25) 37.5-25 MG per tablet Take 0.5 tablets by mouth daily.        Past Surgical History  Procedure Date  . Wisdom tooth extraction   . Muscle biopsy     No Known Allergies  History   Social History  . Marital Status: Married    Spouse Name: N/A    Number of Children: 1  . Years of Education: N/A   Occupational History  .     Social History Main Topics  . Smoking status: Never Smoker   . Smokeless tobacco: Not on file  . Alcohol Use: Yes     WEEKENDS  . Drug Use: Not on file  . Sexually Active: Not on file   Other Topics Concern  . Not on file   Social History Narrative   Tobacco -- smoked "socially" as a teenage x 2-3 years.  Nonsmoker at presentDiet -- avoiding fast food but still eats high in fatExercise -- very active at work    Family History  Problem Relation Age of Onset  . Hypertension Mother   . Heart attack Father 74  . Hypertension Father   . Hyperlipidemia Neg Hx   . Diabetes Neg Hx   . Sudden death Neg  Hx     BP 142/73  Pulse 88  Temp(Src) 97.6 F (36.4 C) (Oral)  Ht 5\' 10"  (1.778 m)  Wt 260 lb (117.935 kg)  BMI 37.31 kg/m2  Review of Systems See HPI above.    Objective:   Physical Exam Gen: NAD Pes planus  L knee: No gross deformity, ecchymoses, swelling.  VMO atrophy. TTP post patellar facets.  No joint line or other TTP. FROM. Negative ant/post drawers. Negative valgus/varus testing. Negative lachmanns. Negative mcmurrays, apleys, apprehension.  Positive clarkes. NV intact distally. Hip abduction strength 5/5.  R knee: No gross deformity, ecchymoses, swelling.  VMO atrophy. TTP post patellar facets.  No joint line or other TTP. FROM. Negative ant/post drawers. Negative valgus/varus testing. Negative lachmanns. Negative mcmurrays, apleys, apprehension.  Positive clarkes. NV intact distally.    Assessment & Plan:  1. Bilateral knee pain - 2/2 patellofemoral syndrome.  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  Start HEP (difficult given his job for him to do formal PT).  Avoid painful activities.  Would likely benefit from arch support with his overpronation.  Tylenol/aleve as needed.  F/u in 6 weeks for reevaluation.

## 2011-11-27 NOTE — Patient Instructions (Signed)
You have patellofemoral syndrome Avoid painful activities (especially squats and lunges, plyometrics, increasing running mileage) as much as possible. Straight leg raise, hip abduction, hip adduction/VMO exercises at least once daily - 3 sets of 15 - add ankle weight when these become too easy - do daily for at least 6 weeks. Consider formal physical therapy (would be difficult given your job). Correct foot breakdown with arch supports (Dr. Jari Sportsman makes some for 15-18 dollars that have good plastic arch support with cushion in heel and the ball of your foot.) Consider shoe evaluation or new shoes yearly. Icing 15 minutes at a time 3-4 times a day as needed. Tylenol and/or aleve as needed for pain Cortisone injections and surgery are not very effective for this condition. Follow up with me in 6 weeks for a reevaluation.

## 2011-11-28 DIAGNOSIS — M25561 Pain in right knee: Secondary | ICD-10-CM | POA: Insufficient documentation

## 2011-11-28 NOTE — Assessment & Plan Note (Signed)
2/2 patellofemoral syndrome.  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  Start HEP (difficult given his job for him to do formal PT).  Avoid painful activities.  Would likely benefit from arch support with his overpronation.  Tylenol/aleve as needed.  F/u in 6 weeks for reevaluation.

## 2011-12-01 ENCOUNTER — Ambulatory Visit (INDEPENDENT_AMBULATORY_CARE_PROVIDER_SITE_OTHER): Payer: BC Managed Care – PPO | Admitting: Internal Medicine

## 2011-12-01 ENCOUNTER — Ambulatory Visit (INDEPENDENT_AMBULATORY_CARE_PROVIDER_SITE_OTHER)
Admission: RE | Admit: 2011-12-01 | Discharge: 2011-12-01 | Disposition: A | Discharge status: Home / Self Care | Payer: BC Managed Care – PPO | Source: Ambulatory Visit | Attending: Internal Medicine | Admitting: Internal Medicine

## 2011-12-01 VITALS — BP 142/84 | HR 101 | Temp 98.2°F | Wt 273.0 lb

## 2011-12-01 DIAGNOSIS — S5000XA Contusion of unspecified elbow, initial encounter: Secondary | ICD-10-CM | POA: Insufficient documentation

## 2011-12-01 NOTE — Assessment & Plan Note (Signed)
Most likely a right elbow contusion. Recommend ice for 2 days, Motrin, x-ray. Will call if not better.

## 2011-12-01 NOTE — Patient Instructions (Signed)
Please get your x-ray at the other [place]  office located at: [address] [place], across from [facility].  Please go to the basement, this is a walk-in facility, they are open from 8:30 to 5:30 PM. Phone number [phone]. ---- Motrin as needed, watch for stomach side effects. Call if not better in 10 days

## 2011-12-01 NOTE — Progress Notes (Signed)
  Subjective:    Patient ID: Benjamin Warner, male    DOB: 1981/04/19, 31 y.o.   MRN: [id number]  HPI Acute visit Last night, was working indoors in his house, fail from 3 feet high when he was coming down from a ladder, landed on his right side. Since then he is hurting in the elbow. No swelling, range of motion is normal.  Past Medical History:  Episode of rhabdomyolysis 8/03 (ck=20,000)-unclear, etiology  Hypertension, dx aprox 2005  snoring--- sleep study 03-2010----> negative for sleep apnea, Rx wt loss  Past Surgical History:  wisdom teeth extracted  2012---Excision of right upper back and left lower back epidermoid  inclusion cyst.  Family History:  MI-- F age 28  Stroke--no  DM--no  hypertension-- F M  No history of colon or prostate cancer.  Social History:  Occupation:truck driver  Married, one child, one on the way  tobacco--smoked "socially" as a teenager x 2 to 3 years. nonsmoker @ present  ETOH-- weekends   Review of Systems Denies loss of consciousness. No direct head injury. No neck or back pain. Right shoulder was slightly sore yesterday but now is better.     Objective:   Physical Exam  General -- alert, well-developed, and overweight appearing. No apparent distress.  Neck --FROM Extremities--  Shoulders symmetric, normal range of motion. Left elbow normal Right elbow, slightly tender to palpation in the lateral epicondyle external. No swelling, no deformities. Range of motion is normal. Distally , the right arm is normal as well, good capillary refill. Neurologic-- alert & oriented X3 and strength normal in all extremities. Psych-- Cognition and judgment appear intact. Alert and cooperative with normal attention span and concentration.  not anxious appearing and not depressed appearing.       Assessment & Plan:

## 2011-12-03 ENCOUNTER — Encounter: Payer: Self-pay | Admitting: Internal Medicine

## 2012-01-08 ENCOUNTER — Ambulatory Visit: Payer: BC Managed Care – PPO | Admitting: Family Medicine

## 2012-02-08 ENCOUNTER — Encounter: Payer: Self-pay | Admitting: Internal Medicine

## 2012-02-08 ENCOUNTER — Ambulatory Visit (INDEPENDENT_AMBULATORY_CARE_PROVIDER_SITE_OTHER): Payer: BC Managed Care – PPO | Admitting: Internal Medicine

## 2012-02-08 VITALS — BP 140/88 | HR 76 | Temp 98.0°F | Wt 267.0 lb

## 2012-02-08 DIAGNOSIS — G5600 Carpal tunnel syndrome, unspecified upper limb: Secondary | ICD-10-CM | POA: Insufficient documentation

## 2012-02-08 NOTE — Patient Instructions (Addendum)
Carpal Tunnel Syndrome You may have carpal tunnel syndrome. This is a common condition. Carpal tunnel syndrome occurs when the tendons, bones, or ligaments in the wrist press against the median nerve as it passes into the hand.  Symptoms can include:  Intermittent numbness.   Pain or a tingling sensation in thumb and first two fingers.  The pain may radiate up to the shoulder. There may even be weakness in the hand muscles. The pain is often worse at night and in the early morning. Nerve conduction tests may be used to prove the diagnosis. Carpal tunnel syndrome is most often due to repeated movements of the hand or wrist. Other causes can include:  Prior injuries.   Diabetes.   Obesity.   Smoking.   Pregnancy. Symptoms that develop during pregnancy often stop when the pregnancy is over.  Treatment includes:  Splinting - A wrist splint helps prevent movements that irritate the nerve. Splints are especially helpful at night when the symptoms are often worse.   Ice packs - Cold packs applied to the palm side of the wrist for 20 minutes every 2 hours while awake may give some relief.   Medication - Medicine to reduce inflammation and pain are often used. Cortisone injections around the nerve may also bring improvement.  Severe cases of carpal tunnel syndrome can require surgery to relieve the pressure on the nerve. This may be necessary if there is evidence of weakness or decreased sensation in your hand, or if your symptoms do not improve with conservative treatment. See your caregiver for follow-up to be certain your condition is improving. Document Released: 08/17/2004 Document Revised: 03/22/2011 Document Reviewed: 05/16/2007 [facility] Patient Information 2012 [place], [place].   Get carpal tunnel syndrome splinters at the pharmacy, wear them at night. Call me for him a better in few weeks. Ibuprofen (Motrin) 200 mg OTC 2 or 3 tabs every 8 hours as need for pain . This medicine can  cause gastritis-ulcers in the stomach, if you develop nausea, stomach pain, change in the color of stools : stop ibuprofen and call us

## 2012-02-08 NOTE — Progress Notes (Signed)
  Subjective:    Patient ID: Benjamin Warner, male    DOB: 1981/07/10, 31 y.o.   MRN: [id number]  HPI Acute visit Chief complaint today is having numbness, R>L, mostly at the 1-2-3 th fingers, worse at night. The patient is doing renovations at home and is using his hands a lot. He also has some pain in the knuckles and the base of the first finger bilaterally.  Denies any neck pain or back pain. Admits to occasional headaches, they are mild, last one or 2 hours, not the worst of his life. Had similar headaches before but lately they're more frequent. 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  Having also bilateral plantar feet pain, not only in the heel, he points to the whole plantar area, walks barefooted a lot, pain is the worst first thing in the  morning when he walks.   Past Medical History:   Episode of rhabdomyolysis 8/03 (ck=20,000)-unclear, etiology   Hypertension, dx aprox 2005   snoring--- sleep study 03-2010----> negative for sleep apnea, Rx wt loss    Past Surgical History:   wisdom teeth extracted   2012---Excision of right upper back and left lower back epidermoid   inclusion cyst.    Family History:   MI-- F age 76   Stroke--no   DM--no   hypertension-- F M   No history of colon or prostate cancer.    Social History:   Occupation:truck driver   Married, one child, one on the way   tobacco--smoked "socially" as a teenager x 2 to 3 years. nonsmoker @ present   ETOH-- weekends    Review of Systems See HPI    Objective:   Physical Exam  General -- alert, well-developed. No apparent distress.  Neck --cervical spine not tender to palpation and full range of motion    Extremities-- no pretibial edema bilaterally ,  significant pes planus, slightly tender at the whole plantar area otherwise inspection and palpation is normal.  Neurologic-- alert & oriented X3 ,strength normal in all extremities. DTRs  symmetric, slightly decreased throughout. EOMI, speech normal, gait normal. Pinprick examination of the extremities normal.  Psych-- Cognition and judgment appear intact. Alert and cooperative with normal attention span and concentration.  not anxious appearing and not depressed appearing.      Assessment & Plan:

## 2012-02-08 NOTE — Assessment & Plan Note (Addendum)
Symptoms consistent with carpal tunnel syndrome, likely related to recent use of his hands doing a home renovation. Recommend Motrin and over-the-counter wrist splints to be used at night. Hopefully this will be a temporary problem. He also has more frequent headaches lately, recommend observation, will call if the problem continue. He has pes planus and pain in the feet, most likely has some degree of plantar fasciitis. Stretching discussed.

## 2012-05-13 ENCOUNTER — Ambulatory Visit: Payer: Self-pay | Admitting: Internal Medicine

## 2012-05-13 DIAGNOSIS — Z0289 Encounter for other administrative examinations: Secondary | ICD-10-CM

## 2012-05-16 ENCOUNTER — Other Ambulatory Visit: Payer: Self-pay

## 2012-05-16 MED ORDER — TRIAMTERENE-HCTZ 37.5-25 MG PO TABS: 0.5000 | ORAL_TABLET | Freq: Every day | ORAL | Status: DC

## 2012-05-16 NOTE — Telephone Encounter (Signed)
Rx sent pt aware and gave new pharmacy to send Rx.        MW

## 2012-06-07 ENCOUNTER — Encounter: Payer: Self-pay | Admitting: Internal Medicine

## 2012-06-12 ENCOUNTER — Telehealth: Payer: Self-pay | Admitting: Internal Medicine

## 2012-06-12 NOTE — Telephone Encounter (Signed)
Message copied by Verner Chol on Wed Jun 12, 2012 10:57 AM ------      Message from: Elwin Sleight      Created: Wed Jun 12, 2012 10:44 AM      Regarding: RE: No show fee DOS 10.21.13      Contact: [phone]       Thank you!      ----- Message -----         From: Theodis Sato McDaniels         Sent: 06/07/2012  12:16 PM           To: Elwin Sleight      Subject: RE: No show fee DOS 10.21.13                             Could not get answer on phone mailed letter advising pt had already received one waiver and per [facility] policy we can only give one, current charge would stand per policy       ----- Message -----         From: Elwin Sleight         Sent: 06/06/2012   3:58 PM           To: Theodis Sato McDaniels      Subject: RE: No show fee DOS 10.21.13                             This is his second one in almost a year, and previously advised it was his 1 waiver.  He is responsible, let it stand.      ----- Message -----         From: Theodis Sato McDaniels         Sent: 05/30/2012  11:10 AM           To: Elwin Sleight      Subject: No show fee DOS 10.21.13                                 Pt called today advised he does not believe he owes this as he does not remember making the appt. NOTE this is patients 2nd No show fee, 1-st was 03/2011 same type of office visit and we did waive. I have reviewed phone tree and patient was called at the phone number patient gave me to call back. I advised pt that the phone tree does so calling him and it does show someone answered at the time of the call. Pt stated he did NOT receive a call. Let me know if we need to waive or let it stand      Thanks

## 2012-06-24 ENCOUNTER — Telehealth: Payer: Self-pay | Admitting: Internal Medicine

## 2012-06-24 ENCOUNTER — Encounter: Payer: Self-pay | Admitting: Internal Medicine

## 2012-06-24 ENCOUNTER — Ambulatory Visit (INDEPENDENT_AMBULATORY_CARE_PROVIDER_SITE_OTHER): Payer: BC Managed Care – PPO | Admitting: Internal Medicine

## 2012-06-24 VITALS — BP 144/80 | HR 87 | Temp 98.1°F | Wt 275.0 lb

## 2012-06-24 DIAGNOSIS — K219 Gastro-esophageal reflux disease without esophagitis: Secondary | ICD-10-CM

## 2012-06-24 MED ORDER — PANTOPRAZOLE SODIUM 40 MG PO TBEC: 40.0000 mg | DELAYED_RELEASE_TABLET | Freq: Every day | ORAL | Status: DC

## 2012-06-24 NOTE — Progress Notes (Signed)
  Subjective:    Patient ID: Benjamin Warner, male    DOB: [birth year], 31 y.o.   MRN: [id number]  HPI Acute visit Last night, he woke up with throat burning, felt like "food was coming up" and had difficulty breathing, symptoms lasted about 10 minutes, he did take some Tums. Currently asymptomatic. On looking back, this happened once or twice a month, usually related to eating late and eating things like pizza.  Past Medical History  Diagnosis Date  . Rhabdomyolysis 8/03    ck=20,000-unclear etiology  . Hypertension approx 2005  . Elevated CK     chronic, ~ 500, saw neuro, muscle Bx 12-2010: negative     Past Surgical History  Procedure Date  . Wisdom tooth extraction   . Muscle biopsy     Review of Systems No  nausea, vomiting, diarrhea or blood in the stools. No dysphasia or odynophagia Some chest pain last night, burning-like. Mild cough last night as well.    Objective:   Physical Exam  General -- alert, well-developed, and overweight appearing. No apparent distress.  Neck --no LADs  Lungs -- normal respiratory effort, no intercostal retractions, no accessory muscle use, and normal breath sounds.   Heart-- normal rate, regular rhythm, no murmur, and no gallop.   Abdomen--soft, non-tender, no distention, no masses, no HSM, no guarding, and no rigidity.    Psych-- Cognition and judgment appear intact. Alert and cooperative with normal attention span and concentration.  not anxious appearing and not depressed appearing.      Assessment & Plan:

## 2012-06-24 NOTE — Assessment & Plan Note (Signed)
Symptoms likely related to sporadic GERD, patient is educated about the diagnoses, information provided about diet, see instructions PPIs for one month, then as needed. Also recommend to lose weight.

## 2012-06-24 NOTE — Patient Instructions (Addendum)
   Take the medication every day 30 minutes before breakfast for one month, after that take it as needed. Avoid foods that trigger symptoms Avoid eating late  If you continue having symptoms let me know  Diet for Gastroesophageal Reflux Disease, Adult Reflux (acid reflux) is when acid from your stomach flows up into the esophagus. When acid comes in contact with the esophagus, the acid causes irritation and soreness (inflammation) in the esophagus. When reflux happens often or so severely that it causes damage to the esophagus, it is called gastroesophageal reflux disease (GERD). Nutrition therapy can help ease the discomfort of GERD. FOODS OR DRINKS TO AVOID OR LIMIT  Smoking or chewing tobacco. Nicotine is one of the most potent stimulants to acid production in the gastrointestinal tract.  Caffeinated and decaffeinated coffee and black tea.  Regular or low-calorie carbonated beverages or energy drinks (caffeine-free carbonated beverages are allowed).   Strong spices, such as black pepper, white pepper, red pepper, cayenne, curry powder, and chili powder.  Peppermint or spearmint.  Chocolate.  High-fat foods, including meats and fried foods. Extra added fats including oils, butter, salad dressings, and nuts. Limit these to less than 8 tsp per day.  Fruits and vegetables if they are not tolerated, such as citrus fruits or tomatoes.  Alcohol.  Any food that seems to aggravate your condition. If you have questions regarding your diet, call your caregiver or a registered dietitian. OTHER THINGS THAT MAY HELP GERD INCLUDE:   Eating your meals slowly, in a relaxed setting.  Eating 5 to 6 small meals per day instead of 3 large meals.  Eliminating food for a period of time if it causes distress.  Not lying down until 3 hours after eating a meal.  Keeping the head of your bed raised 6 to 9 inches (15 to 23 cm) by using a foam wedge or blocks under the legs of the bed. Lying flat may  make symptoms worse.  Being physically active. Weight loss may be helpful in reducing reflux in overweight or obese adults.  Wear loose fitting clothing EXAMPLE MEAL PLAN This meal plan is approximately 2,000 calories based on https://www.[facility].org/ meal planning guidelines. Breakfast   cup cooked oatmeal.  1 cup strawberries.  1 cup low-fat milk.  1 oz almonds. Snack  1 cup cucumber slices.  6 oz yogurt (made from low-fat or fat-free milk). Lunch  2 slice whole-wheat bread.  2 oz sliced [place].  2 tsp mayonnaise.  1 cup blueberries.  1 cup snap peas. Snack  6 whole-wheat crackers.  1 oz string cheese. Dinner   cup brown rice.  1 cup mixed veggies.  1 tsp olive oil.  3 oz grilled fish. Document Released: 07/10/2005 Document Revised: 10/02/2011 Document Reviewed: 05/26/2011 [facility] Patient Information 2013 [place], [place].

## 2012-06-24 NOTE — Telephone Encounter (Signed)
Ok with me 

## 2012-06-24 NOTE — Telephone Encounter (Signed)
Patient called stating he would like to switch to [place] office due to location. He states he needs written authorization from Dr. Drue Novel sent to [place] so he can change his PCP. Please advise.

## 2012-06-27 NOTE — Telephone Encounter (Signed)
Ok with me 

## 2012-06-27 NOTE — Telephone Encounter (Signed)
Patient would like to switch from Dr. Drue Novel to Dr. Caryl Never. I advised pt he needs to call [place] office to schedule appointment.

## 2012-09-12 ENCOUNTER — Telehealth: Payer: Self-pay | Admitting: Internal Medicine

## 2012-09-12 NOTE — Telephone Encounter (Signed)
Suggest Dr Kim 

## 2012-09-12 NOTE — Telephone Encounter (Signed)
Ok to establish 

## 2012-09-12 NOTE — Telephone Encounter (Signed)
Okay with me, however he prefers

## 2012-09-12 NOTE — Telephone Encounter (Addendum)
Pt is requesting internal med doctor male

## 2012-09-12 NOTE — Telephone Encounter (Signed)
Pt would like to switch to  Dr Amador Cunas   due to  location

## 2012-09-16 NOTE — Telephone Encounter (Signed)
lmom for pt to callback sch appt °

## 2012-09-16 NOTE — Telephone Encounter (Signed)
Pt is sch for 12-30-12

## 2012-12-25 ENCOUNTER — Other Ambulatory Visit: Payer: Self-pay | Admitting: Internal Medicine

## 2012-12-25 NOTE — Telephone Encounter (Signed)
Refill done.  

## 2012-12-30 ENCOUNTER — Ambulatory Visit (INDEPENDENT_AMBULATORY_CARE_PROVIDER_SITE_OTHER): Payer: Self-pay | Admitting: Internal Medicine

## 2012-12-30 ENCOUNTER — Encounter: Payer: Self-pay | Admitting: Internal Medicine

## 2012-12-30 VITALS — BP 120/80 | HR 80 | Temp 98.6°F | Resp 20 | Ht 70.25 in | Wt 273.0 lb

## 2012-12-30 DIAGNOSIS — R945 Abnormal results of liver function studies: Secondary | ICD-10-CM

## 2012-12-30 DIAGNOSIS — Z0000 Encounter for general adult medical examination without abnormal findings: Secondary | ICD-10-CM

## 2012-12-30 DIAGNOSIS — I10 Essential (primary) hypertension: Secondary | ICD-10-CM

## 2012-12-30 DIAGNOSIS — R748 Abnormal levels of other serum enzymes: Secondary | ICD-10-CM

## 2012-12-30 MED ORDER — PANTOPRAZOLE SODIUM 40 MG PO TBEC: 40.0000 mg | DELAYED_RELEASE_TABLET | Freq: Every day | ORAL | Status: DC

## 2012-12-30 MED ORDER — TRIAMTERENE-HCTZ 37.5-25 MG PO TABS: ORAL_TABLET | ORAL | Status: DC

## 2012-12-30 NOTE — Patient Instructions (Addendum)
Limit your sodium (Salt) intake    It is important that you exercise regularly, at least 20 minutes 3 to 4 times per week.  If you develop chest pain or shortness of breath seek  medical attention.  You need to lose weight.  Consider a lower calorie diet and regular exercise.  Please check your blood pressure on a regular basis.  If it is consistently greater than 150/90, please make an office appointment.  Return in one year for follow-up Preventive Care for Adults, Male A healthy lifestyle and preventive care can promote health and wellness. Preventive health guidelines for men include the following key practices:  A routine yearly physical is a good way to check with your caregiver about your health and preventative screening. It is a chance to share any concerns and updates on your health, and to receive a thorough exam.  Visit your dentist for a routine exam and preventative care every 6 months. Brush your teeth twice a day and floss once a day. Good oral hygiene prevents tooth decay and gum disease.  The frequency of eye exams is based on your age, health, family medical history, use of contact lenses, and other factors. Follow your caregiver's recommendations for frequency of eye exams.  Eat a healthy diet. Foods like vegetables, fruits, whole grains, low-fat dairy products, and lean protein foods contain the nutrients you need without too many calories. Decrease your intake of foods high in solid fats, added sugars, and salt. Eat the right amount of calories for you.Get information about a proper diet from your caregiver, if necessary.  Regular physical exercise is one of the most important things you can do for your health. Most adults should get at least 150 minutes of moderate-intensity exercise (any activity that increases your heart rate and causes you to sweat) each week. In addition, most adults need muscle-strengthening exercises on 2 or more days a week.  Maintain a healthy  weight. The body mass index (BMI) is a screening tool to identify possible weight problems. It provides an estimate of body fat based on height and weight. Your caregiver can help determine your BMI, and can help you achieve or maintain a healthy weight.For adults 20 years and older:  A BMI below 18.5 is considered underweight.  A BMI of 18.5 to 24.9 is normal.  A BMI of 25 to 29.9 is considered overweight.  A BMI of 30 and above is considered obese.  Maintain normal blood lipids and cholesterol levels by exercising and minimizing your intake of saturated fat. Eat a balanced diet with plenty of fruit and vegetables. Blood tests for lipids and cholesterol should begin at age 95 and be repeated every 5 years. If your lipid or cholesterol levels are high, you are over 50, or you are a high risk for heart disease, you may need your cholesterol levels checked more frequently.Ongoing high lipid and cholesterol levels should be treated with medicines if diet and exercise are not effective.  If you smoke, find out from your caregiver how to quit. If you do not use tobacco, do not start.  If you choose to drink alcohol, do not exceed 2 drinks per day. One drink is considered to be 12 ounces (355 mL) of beer, 5 ounces (148 mL) of wine, or 1.5 ounces (44 mL) of liquor.  Avoid use of street drugs. Do not share needles with anyone. Ask for help if you need support or instructions about stopping the use of drugs.  High blood  pressure causes heart disease and increases the risk of stroke. Your blood pressure should be checked at least every 1 to 2 years. Ongoing high blood pressure should be treated with medicines, if weight loss and exercise are not effective.  If you are 26 to 32 years old, ask your caregiver if you should take aspirin to prevent heart disease.  Diabetes screening involves taking a blood sample to check your fasting blood sugar level. This should be done once every 3 years, after age 68,  if you are within normal weight and without risk factors for diabetes. Testing should be considered at a younger age or be carried out more frequently if you are overweight and have at least 1 risk factor for diabetes.  Colorectal cancer can be detected and often prevented. Most routine colorectal cancer screening begins at the age of 68 and continues through age 37. However, your caregiver may recommend screening at an earlier age if you have risk factors for colon cancer. On a yearly basis, your caregiver may provide home test kits to check for hidden blood in the stool. Use of a small camera at the end of a tube, to directly examine the colon (sigmoidoscopy or colonoscopy), can detect the earliest forms of colorectal cancer. Talk to your caregiver about this at age 52, when routine screening begins. Direct examination of the colon should be repeated every 5 to 10 years through age 61, unless early forms of pre-cancerous polyps or small growths are found.  Hepatitis C blood testing is recommended for all people born from [birth year] through 1965 and any individual with known risks for hepatitis C.  Practice safe sex. Use condoms and avoid high-risk sexual practices to reduce the spread of sexually transmitted infections (STIs). STIs include gonorrhea, chlamydia, syphilis, trichomonas, herpes, HPV, and human immunodeficiency virus (HIV). Herpes, HIV, and HPV are viral illnesses that have no cure. They can result in disability, cancer, and death.  A one-time screening for abdominal aortic aneurysm (AAA) and surgical repair of large AAAs by sound wave imaging (ultrasonography) is recommended for ages 22 to 69 years who are current or former smokers.  Healthy men should no longer receive prostate-specific antigen (PSA) blood tests as part of routine cancer screening. Consult with your caregiver about prostate cancer screening.  Testicular cancer screening is not recommended for adult males who have no symptoms.  Screening includes self-exam, caregiver exam, and other screening tests. Consult with your caregiver about any symptoms you have or any concerns you have about testicular cancer.  Use sunscreen with skin protection factor (SPF) of 30 or more. Apply sunscreen liberally and repeatedly throughout the day. You should seek shade when your shadow is shorter than you. Protect yourself by wearing long sleeves, pants, a wide-brimmed hat, and sunglasses year round, whenever you are outdoors.  Once a month, do a whole body skin exam, using a mirror to look at the skin on your back. Notify your caregiver of new moles, moles that have irregular borders, moles that are larger than a pencil eraser, or moles that have changed in shape or color.  Stay current with required immunizations.  Influenza. You need a dose every fall (or winter). The composition of the flu vaccine changes each year, so being vaccinated once is not enough.  Pneumococcal polysaccharide. You need 1 to 2 doses if you smoke cigarettes or if you have certain chronic medical conditions. You need 1 dose at age 28 (or older) if you have never been vaccinated.  Tetanus, diphtheria, pertussis (Tdap, Td). Get 1 dose of Tdap vaccine if you are younger than age 53 years, are over 94 and have contact with an infant, are a Research scientist (physical sciences), or simply want to be protected from whooping cough. After that, you need a Td booster dose every 10 years. Consult your caregiver if you have not had at least 3 tetanus and diphtheria-containing shots sometime in your life or have a deep or dirty wound.  HPV. This vaccine is recommended for males 13 through 32 years of age. This vaccine may be given to men 22 through 32 years of age who have not completed the 3 dose series. It is recommended for men through age 71 who have sex with men or whose immune system is weakened because of HIV infection, other illness, or medications. The vaccine is given in 3 doses over 6  months.  Measles, mumps, rubella (MMR). You need at least 1 dose of MMR if you were born in 1957 or later. You may also need a 2nd dose.  Meningococcal. If you are age 9 to 31 years and a Orthoptist living in a residence hall, or have one of several medical conditions, you need to get vaccinated against meningococcal disease. You may also need additional booster doses.  Zoster (shingles). If you are age 38 years or older, you should get this vaccine.  Varicella (chickenpox). If you have never had chickenpox or you were vaccinated but received only 1 dose, talk to your caregiver to find out if you need this vaccine.  Hepatitis A. You need this vaccine if you have a specific risk factor for hepatitis A virus infection, or you simply wish to be protected from this disease. The vaccine is usually given as 2 doses, 6 to 18 months apart.  Hepatitis B. You need this vaccine if you have a specific risk factor for hepatitis B virus infection or you simply wish to be protected from this disease. The vaccine is given in 3 doses, usually over 6 months. Preventative Service / Frequency Ages 69 to 60  Blood pressure check.** / Every 1 to 2 years.  Lipid and cholesterol check.** / Every 5 years beginning at age 78.  Hepatitis C blood test.** / For any individual with known risks for hepatitis C.  Skin self-exam. / Monthly.  Influenza immunization.** / Every year.  Pneumococcal polysaccharide immunization.** / 1 to 2 doses if you smoke cigarettes or if you have certain chronic medical conditions.  Tetanus, diphtheria, pertussis (Tdap,Td) immunization. / A one-time dose of Tdap vaccine. After that, you need a Td booster dose every 10 years.  HPV immunization. / 3 doses over 6 months, if 26 and younger.  Measles, mumps, rubella (MMR) immunization. / You need at least 1 dose of MMR if you were born in 1957 or later. You may also need a 2nd dose.  Meningococcal immunization. / 1 dose  if you are age 39 to 86 years and a Orthoptist living in a residence hall, or have one of several medical conditions, you need to get vaccinated against meningococcal disease. You may also need additional booster doses.  Varicella immunization.** / Consult your caregiver.  Hepatitis A immunization.** / Consult your caregiver. 2 doses, 6 to 18 months apart.  Hepatitis B immunization.** / Consult your caregiver. 3 doses usually over 6 months. Ages 31 to 86  Blood pressure check.** / Every 1 to 2 years.  Lipid and cholesterol check.** / Every 5 years  beginning at age 60.  Fecal occult blood test (FOBT) of stool. / Every year beginning at age 31 and continuing until age 29. You may not have to do this test if you get colonoscopy every 10 years.  Flexible sigmoidoscopy** or colonoscopy.** / Every 5 years for a flexible sigmoidoscopy or every 10 years for a colonoscopy beginning at age 64 and continuing until age 55.  Hepatitis C blood test.** / For all people born from [birth year] through 1965 and any individual with known risks for hepatitis C.  Skin self-exam. / Monthly.  Influenza immunization.** / Every year.  Pneumococcal polysaccharide immunization.** / 1 to 2 doses if you smoke cigarettes or if you have certain chronic medical conditions.  Tetanus, diphtheria, pertussis (Tdap/Td) immunization.** / A one-time dose of Tdap vaccine. After that, you need a Td booster dose every 10 years.  Measles, mumps, rubella (MMR) immunization. / You need at least 1 dose of MMR if you were born in 1957 or later. You may also need a 2nd dose.  Varicella immunization.**/ Consult your caregiver.  Meningococcal immunization.** / Consult your caregiver.  Hepatitis A immunization.** / Consult your caregiver. 2 doses, 6 to 18 months apart.  Hepatitis B immunization.** / Consult your caregiver. 3 doses, usually over 6 months. Ages 19 and over  Blood pressure check.** / Every 1 to 2  years.  Lipid and cholesterol check.**/ Every 5 years beginning at age 64.  Fecal occult blood test (FOBT) of stool. / Every year beginning at age 26 and continuing until age 57. You may not have to do this test if you get colonoscopy every 10 years.  Flexible sigmoidoscopy** or colonoscopy.** / Every 5 years for a flexible sigmoidoscopy or every 10 years for a colonoscopy beginning at age 42 and continuing until age 36.  Hepatitis C blood test.** / For all people born from [birth year] through 1965 and any individual with known risks for hepatitis C.  Abdominal aortic aneurysm (AAA) screening.** / A one-time screening for ages 28 to 54 years who are current or former smokers.  Skin self-exam. / Monthly.  Influenza immunization.** / Every year.  Pneumococcal polysaccharide immunization.** / 1 dose at age 80 (or older) if you have never been vaccinated.  Tetanus, diphtheria, pertussis (Tdap, Td) immunization. / A one-time dose of Tdap vaccine if you are over 65 and have contact with an infant, are a Research scientist (physical sciences), or simply want to be protected from whooping cough. After that, you need a Td booster dose every 10 years.  Varicella immunization. ** / Consult your caregiver.  Meningococcal immunization.** / Consult your caregiver.  Hepatitis A immunization. ** / Consult your caregiver. 2 doses, 6 to 18 months apart.  Hepatitis B immunization.** / Check with your caregiver. 3 doses, usually over 6 months. **Family history and personal history of risk and conditions may change your caregiver's recommendations. Document Released: 09/05/2001 Document Revised: 10/02/2011 Document Reviewed: 12/05/2010 [facility] Patient Information 2014 [place], [place].

## 2012-12-30 NOTE — Progress Notes (Signed)
Subjective:    Patient ID: Benjamin Warner, male    DOB: February 01, 1981, 32 y.o.   MRN: [id number]  HPI  32 year old patient who is seen today to establish with our practice. Medical problems include exogenous obesity hypertension and chronically elevated CPK. He is status post muscle biopsy in 2012 that was normal. He has had a history of exercise associated rhabdomyolysis. He has been evaluated for OSA in the past  Social history born in [place]. Nonsmoker. Married 2 young daughters. Works as a Naval architect  Past Medical History  Diagnosis Date  . Rhabdomyolysis 8/03    ck=20,000-unclear etiology  . Hypertension approx 2005  . Elevated CK     chronic, ~ 500, saw neuro, muscle Bx 12-2010: negative   . Snoring     sleep study 03-2010----> negative for sleep apnea, Rx wt loss      History   Social History  . Marital Status: Married    Spouse Name: N/A    Number of Children: 1  . Years of Education: N/A   Occupational History  .     Social History Main Topics  . Smoking status: Never Smoker   . Smokeless tobacco: Not on file  . Alcohol Use: Yes     Comment: WEEKENDS  . Drug Use: Not on file  . Sexually Active: Not on file   Other Topics Concern  . Not on file   Social History Narrative   Tobacco -- smoked "socially" as a teenage x 2-3 years.  Nonsmoker at present   Diet -- avoiding fast food but still eats high in fat   Exercise -- very active at work    Past Surgical History  Procedure Laterality Date  . Wisdom tooth extraction    . Muscle biopsy    . Cyst excision      2012---Excision of right upper back and left lower back epidermoid      Family History  Problem Relation Age of Onset  . Hypertension Mother   . Heart attack Father 74  . Hypertension Father   . Hyperlipidemia Neg Hx   . Diabetes Neg Hx   . Sudden death Neg Hx     No Known Allergies  Current Outpatient Prescriptions on File Prior to Visit  Medication Sig Dispense Refill  .  triamterene-hydrochlorothiazide (MAXZIDE-25) 37.5-25 MG per tablet TAKE 1/2 TABLET EVERY DAY  30 tablet  3   No current facility-administered medications on file prior to visit.    BP 120/80  Pulse 80  Temp(Src) 98.6 F (37 C) (Oral)  Resp 20  Ht 5' 10.25" (1.784 m)  Wt 273 lb (123.832 kg)  BMI 38.91 kg/m2  SpO2 98%       Review of Systems  Constitutional: Negative for fever, chills, appetite change and fatigue.  HENT: Negative for hearing loss, ear pain, congestion, sore throat, trouble swallowing, neck stiffness, dental problem, voice change and tinnitus.   Eyes: Negative for pain, discharge and visual disturbance.  Respiratory: Negative for cough, chest tightness, wheezing and stridor.   Cardiovascular: Negative for chest pain, palpitations and leg swelling.  Gastrointestinal: Negative for nausea, vomiting, abdominal pain, diarrhea, constipation, blood in stool and abdominal distention.  Genitourinary: Negative for urgency, hematuria, flank pain, discharge, difficulty urinating and genital sores.  Musculoskeletal: Negative for myalgias, back pain, joint swelling, arthralgias and gait problem.  Skin: Negative for rash.  Neurological: Negative for dizziness, syncope, speech difficulty, weakness, numbness and headaches.  Hematological: Negative for adenopathy. Does not  bruise/bleed easily.  Psychiatric/Behavioral: Negative for behavioral problems and dysphoric mood. The patient is not nervous/anxious.        Objective:   Physical Exam  Constitutional: He appears well-developed and well-nourished.  HENT:  Head: Normocephalic and atraumatic.  Right Ear: External ear normal.  Left Ear: External ear normal.  Nose: Nose normal.  Mouth/Throat: Oropharynx is clear and moist.  Eyes: Conjunctivae and EOM are normal. Pupils are equal, round, and reactive to light. No scleral icterus.  Neck: Normal range of motion. Neck supple. No JVD present. No thyromegaly present.   Cardiovascular: Regular rhythm, normal heart sounds and intact distal pulses.  Exam reveals no gallop and no friction rub.   No murmur heard. Pulmonary/Chest: Effort normal and breath sounds normal. He exhibits no tenderness.  Abdominal: Soft. Bowel sounds are normal. He exhibits no distension and no mass. There is no tenderness.  Genitourinary: Penis normal.  Musculoskeletal: Normal range of motion. He exhibits no edema and no tenderness.  Lymphadenopathy:    He has no cervical adenopathy.  Neurological: He is alert. He has normal reflexes. No cranial nerve deficit. Coordination normal.  Skin: Skin is warm and dry. No rash noted.  Psychiatric: He has a normal mood and affect. His behavior is normal.          Assessment & Plan:   Preventive health exam Chronically elevated CPK with history of rhabdomyolysis Exogenous obesity Hypertension well controlled   Home blood pressure monitoring encouraged Low-salt diet recommended Regular exercise encouraged  Recheck 1 year

## 2012-12-31 LAB — CK TOTAL AND CKMB (NOT AT ARMC)
CK, MB: 9.6 ng/mL — ABNORMAL HIGH (ref 0.3–4.0)
Relative Index: 1.4 (ref 0.0–2.5)
Total CK: 664 U/L — ABNORMAL HIGH (ref 7–232)

## 2013-01-02 ENCOUNTER — Encounter (HOSPITAL_COMMUNITY): Payer: Self-pay | Admitting: Adult Health

## 2013-01-02 DIAGNOSIS — K7689 Other specified diseases of liver: Secondary | ICD-10-CM | POA: Diagnosis present

## 2013-01-02 DIAGNOSIS — I10 Essential (primary) hypertension: Secondary | ICD-10-CM | POA: Diagnosis present

## 2013-01-02 DIAGNOSIS — R82998 Other abnormal findings in urine: Secondary | ICD-10-CM | POA: Diagnosis present

## 2013-01-02 DIAGNOSIS — M6282 Rhabdomyolysis: Principal | ICD-10-CM | POA: Diagnosis present

## 2013-01-02 DIAGNOSIS — R5381 Other malaise: Secondary | ICD-10-CM | POA: Diagnosis present

## 2013-01-02 DIAGNOSIS — R7402 Elevation of levels of lactic acid dehydrogenase (LDH): Secondary | ICD-10-CM | POA: Diagnosis present

## 2013-01-02 DIAGNOSIS — F1010 Alcohol abuse, uncomplicated: Secondary | ICD-10-CM | POA: Diagnosis present

## 2013-01-02 DIAGNOSIS — Z8249 Family history of ischemic heart disease and other diseases of the circulatory system: Secondary | ICD-10-CM

## 2013-01-02 DIAGNOSIS — R7401 Elevation of levels of liver transaminase levels: Secondary | ICD-10-CM | POA: Diagnosis present

## 2013-01-02 DIAGNOSIS — E860 Dehydration: Secondary | ICD-10-CM | POA: Diagnosis present

## 2013-01-02 NOTE — ED Notes (Signed)
Presents post work out from [employer] with fatigue, bilateral arm pain and muscle tightness and dark urine, began this AM. Pt reports, "I have had Rhabdo before and this feels the same"

## 2013-01-03 ENCOUNTER — Inpatient Hospital Stay (HOSPITAL_COMMUNITY)
Admission: EM | Admit: 2013-01-03 | Discharge: 2013-01-07 | DRG: 248 | Disposition: A | Discharge status: Home / Self Care | Payer: BC Managed Care – PPO | Attending: Internal Medicine | Admitting: Internal Medicine

## 2013-01-03 ENCOUNTER — Encounter (HOSPITAL_COMMUNITY): Payer: Self-pay | Admitting: Internal Medicine

## 2013-01-03 DIAGNOSIS — R945 Abnormal results of liver function studies: Secondary | ICD-10-CM

## 2013-01-03 DIAGNOSIS — R748 Abnormal levels of other serum enzymes: Secondary | ICD-10-CM

## 2013-01-03 DIAGNOSIS — I10 Essential (primary) hypertension: Secondary | ICD-10-CM | POA: Diagnosis present

## 2013-01-03 DIAGNOSIS — K219 Gastro-esophageal reflux disease without esophagitis: Secondary | ICD-10-CM

## 2013-01-03 DIAGNOSIS — K648 Other hemorrhoids: Secondary | ICD-10-CM

## 2013-01-03 DIAGNOSIS — M6282 Rhabdomyolysis: Secondary | ICD-10-CM

## 2013-01-03 DIAGNOSIS — Z0000 Encounter for general adult medical examination without abnormal findings: Secondary | ICD-10-CM

## 2013-01-03 HISTORY — DX: Gastro-esophageal reflux disease without esophagitis: K21.9

## 2013-01-03 LAB — CBC WITH DIFFERENTIAL/PLATELET
Basophils Absolute: 0 10*3/uL (ref 0.0–0.1)
Lymphocytes Relative: 31 % (ref 12–46)
Lymphs Abs: 3.7 10*3/uL (ref 0.7–4.0)
MCV: 81.7 fL (ref 78.0–100.0)
Neutro Abs: 7.6 10*3/uL (ref 1.7–7.7)
Neutrophils Relative %: 63 % (ref 43–77)
Platelets: 227 10*3/uL (ref 150–400)
RBC: 5.35 MIL/uL (ref 4.22–5.81)
RDW: 13.9 % (ref 11.5–15.5)
WBC: 12 10*3/uL — ABNORMAL HIGH (ref 4.0–10.5)

## 2013-01-03 LAB — COMPREHENSIVE METABOLIC PANEL
ALT: 231 U/L — ABNORMAL HIGH (ref 0–53)
ALT: 229 U/L — ABNORMAL HIGH (ref 0–53)
AST: 1037 U/L — ABNORMAL HIGH (ref 0–37)
AST: 905 U/L — ABNORMAL HIGH (ref 0–37)
Albumin: 3.3 g/dL — ABNORMAL LOW (ref 3.5–5.2)
Alkaline Phosphatase: 63 U/L (ref 39–117)
CO2: 27 mEq/L (ref 19–32)
CO2: 26 mEq/L (ref 19–32)
Chloride: 106 mEq/L (ref 96–112)
Creatinine, Ser: 1.13 mg/dL (ref 0.50–1.35)
GFR calc Af Amer: >90 mL/min (ref >90)
GFR calc non Af Amer: 79 mL/min — ABNORMAL LOW (ref >90)
Glucose, Bld: 119 mg/dL — ABNORMAL HIGH (ref 70–99)
Potassium: 4.1 mEq/L (ref 3.5–5.1)
Potassium: 3.9 mEq/L (ref 3.5–5.1)
Sodium: 136 mEq/L (ref 135–145)
Sodium: 139 mEq/L (ref 135–145)
Total Bilirubin: 0.4 mg/dL (ref 0.3–1.2)

## 2013-01-03 LAB — URINALYSIS, ROUTINE W REFLEX MICROSCOPIC
Bilirubin Urine: NEGATIVE
Bilirubin Urine: NEGATIVE
Bilirubin Urine: NEGATIVE
Glucose, UA: NEGATIVE mg/dL
Glucose, UA: NEGATIVE mg/dL
Ketones, ur: NEGATIVE mg/dL
Ketones, ur: NEGATIVE mg/dL
Protein, ur: NEGATIVE mg/dL
Specific Gravity, Urine: 1.012 (ref 1.005–1.030)
Specific Gravity, Urine: 1.013 (ref 1.005–1.030)
Urobilinogen, UA: 0.2 mg/dL (ref 0.0–1.0)
pH: 6 (ref 5.0–8.0)
pH: 6 (ref 5.0–8.0)

## 2013-01-03 LAB — CK: Total CK: >50000 U/L — ABNORMAL HIGH (ref 7–232)

## 2013-01-03 LAB — BASIC METABOLIC PANEL
BUN: 13 mg/dL (ref 6–23)
BUN: 13 mg/dL (ref 6–23)
CO2: 27 mEq/L (ref 19–32)
Calcium: 8.6 mg/dL (ref 8.4–10.5)
Chloride: 103 mEq/L (ref 96–112)
Glucose, Bld: 105 mg/dL — ABNORMAL HIGH (ref 70–99)
Glucose, Bld: 90 mg/dL (ref 70–99)
Potassium: 3.8 mEq/L (ref 3.5–5.1)
Potassium: 3.9 mEq/L (ref 3.5–5.1)
Sodium: 138 mEq/L (ref 135–145)

## 2013-01-03 LAB — URINE MICROSCOPIC-ADD ON

## 2013-01-03 LAB — PHOSPHORUS: Phosphorus: 2.6 mg/dL (ref 2.3–4.6)

## 2013-01-03 LAB — TSH: TSH: 2.824 u[IU]/mL (ref 0.350–4.500)

## 2013-01-03 MED ORDER — SODIUM CHLORIDE 0.9 % IV SOLN
1000.0000 mL | Freq: Once | INTRAVENOUS | Status: AC
  Administered 2013-01-03: 1000 mL via INTRAVENOUS

## 2013-01-03 MED ORDER — SODIUM CHLORIDE 0.9 % IJ SOLN
3.0000 mL | Freq: Two times a day (BID) | INTRAMUSCULAR | Status: DC
Start: 2013-01-03 — End: 2013-01-07
  Administered 2013-01-03: 3 mL via INTRAVENOUS

## 2013-01-03 MED ORDER — SODIUM CHLORIDE 0.9 % IV BOLUS (SEPSIS)
1000.0000 mL | Freq: Once | INTRAVENOUS | Status: AC
  Administered 2013-01-03: 1000 mL via INTRAVENOUS

## 2013-01-03 MED ORDER — SODIUM CHLORIDE 0.9 % IV SOLN
1000.0000 mL | INTRAVENOUS | Status: DC
  Administered 2013-01-03: 1000 mL via INTRAVENOUS

## 2013-01-03 MED ORDER — ACETAMINOPHEN 325 MG PO TABS: 650.0000 mg | ORAL_TABLET | Freq: Four times a day (QID) | ORAL | Status: DC | PRN

## 2013-01-03 MED ORDER — SODIUM CHLORIDE 0.9 % IV SOLN
INTRAVENOUS | Status: AC
  Administered 2013-01-03: 16:00:00 via INTRAVENOUS

## 2013-01-03 MED ORDER — HYDROCODONE-ACETAMINOPHEN 5-325 MG PO TABS
1.0000 | ORAL_TABLET | ORAL | Status: DC | PRN
  Administered 2013-01-05: 2 via ORAL
  Administered 2013-01-05: 1 via ORAL
  Filled 2013-01-03: qty 1
  Filled 2013-01-03: qty 2

## 2013-01-03 MED ORDER — ACETAMINOPHEN 650 MG RE SUPP: 650.0000 mg | Freq: Four times a day (QID) | RECTAL | Status: DC | PRN

## 2013-01-03 MED ORDER — ONDANSETRON HCL 4 MG/2ML IJ SOLN: 4.0000 mg | Freq: Four times a day (QID) | INTRAMUSCULAR | Status: DC | PRN

## 2013-01-03 MED ORDER — MORPHINE SULFATE 4 MG/ML IJ SOLN
4.0000 mg | INTRAMUSCULAR | Status: DC | PRN
  Administered 2013-01-03: 4 mg via INTRAVENOUS
  Filled 2013-01-03: qty 1

## 2013-01-03 MED ORDER — SODIUM BICARBONATE 8.4 % IV SOLN
Freq: Once | INTRAVENOUS | Status: AC
  Administered 2013-01-03: 08:00:00 via INTRAVENOUS
  Filled 2013-01-03: qty 1000

## 2013-01-03 MED ORDER — ONDANSETRON HCL 4 MG PO TABS: 4.0000 mg | ORAL_TABLET | Freq: Four times a day (QID) | ORAL | Status: DC | PRN

## 2013-01-03 MED ORDER — SODIUM CHLORIDE 0.9 % IV BOLUS (SEPSIS): 1000.0000 mL | Freq: Once | INTRAVENOUS | Status: AC

## 2013-01-03 MED FILL — Sodium Bicarbonate IV Soln 8.4%: INTRAVENOUS | Qty: 50 | Status: AC

## 2013-01-03 MED FILL — Dextrose 5% w/ Sodium Chloride 0.45%: INTRAVENOUS | Qty: 1000 | Status: AC

## 2013-01-03 NOTE — Progress Notes (Signed)
[facility] PROGRESS NOTE  Benjamin Warner WUJ:[id number] DOB: 1980/08/16 DOA: 01/03/2013 PCP: Willow Ora, MD  I have seen and examined patient is a 32 year old male with prior history of rhabdomyolysis , HTN admitted this a.m. by Dr. Adela Glimpse complaints of dark urine, myalgias following vigorous exercise and found to have rhabdomyolysis. States he feels better but still some upper extremity myalgias. LFTs elevated as well, will obtain hepatitis panel and follow. Will continue current management as per Dr. Adela Glimpse, follow recheck CKs and continue to hydrate as appropriate.   [name]  [facility] Pager [phone]. If 7PM-7AM, please contact night-coverage at [email or web address], password [facility] 01/03/2013, 9:07 AM  LOS: 0 days

## 2013-01-03 NOTE — ED Provider Notes (Signed)
History    CSN: [id number] Arrival date & time 01/02/13  2331  First MD Initiated Contact with Patient 01/03/13 0112      Chief Complaint  Patient presents with  . Muscle Pain     HPI Pt was working out with light weights on tues/wednesday.  He had been working out for 2 months so he did not think he over exerted himself.  Thursday he started feeling fatigued and felt his muscles were tight.  He also noticed dark urine.  He has had rhabdo before and realized this felt similar.  He has had an evaluation in the past and they do not know whey he has this problem.  No fevers.  Still able to urinate.  Past Medical History  Diagnosis Date  . Rhabdomyolysis 8/03    ck=20,000-unclear etiology  . Hypertension approx 2005  . Elevated CK     chronic, ~ 500, saw neuro, muscle Bx 12-2010: negative   . Snoring     sleep study 03-2010----> negative for sleep apnea, Rx wt loss      Past Surgical History  Procedure Laterality Date  . Wisdom tooth extraction    . Muscle biopsy    . Cyst excision      2012---Excision of right upper back and left lower back epidermoid      Family History  Problem Relation Age of Onset  . Hypertension Mother   . Heart attack Father 85  . Hypertension Father   . Hyperlipidemia Neg Hx   . Diabetes Neg Hx   . Sudden death Neg Hx     History  Substance Use Topics  . Smoking status: Never Smoker   . Smokeless tobacco: Not on file  . Alcohol Use: Yes     Comment: WEEKENDS      Review of Systems  All other systems reviewed and are negative.    Allergies  Review of patient's allergies indicates no known allergies.  Home Medications   Current Outpatient Rx  Name  Route  Sig  Dispense  Refill  . pantoprazole (PROTONIX) 40 MG tablet   Oral   Take 1 tablet (40 mg total) by mouth daily.   90 tablet   3   . Probiotic Product (PROBIOTIC DAILY PO)   Oral   Take 1 tablet by mouth daily. For 7 days         . triamterene-hydrochlorothiazide  (MAXZIDE-25) 37.5-25 MG per tablet   Oral   Take 0.5 tablets by mouth daily.           BP 155/83  Pulse 98  Temp(Src) 98.4 F (36.9 C) (Oral)  Resp 16  SpO2 98%  Physical Exam  Nursing note and vitals reviewed. Constitutional: He appears well-developed and well-nourished. No distress.  muscular  HENT:  Head: Normocephalic and atraumatic.  Right Ear: External ear normal.  Left Ear: External ear normal.  Eyes: Conjunctivae are normal. Right eye exhibits no discharge. Left eye exhibits no discharge. No scleral icterus.  Neck: Neck supple. No tracheal deviation present.  Cardiovascular: Normal rate, regular rhythm and intact distal pulses.   Pulmonary/Chest: Effort normal and breath sounds normal. No stridor. No respiratory distress. He has no wheezes. He has no rales.  Abdominal: Soft. Bowel sounds are normal. He exhibits no distension. There is no tenderness. There is no rebound and no guarding.  Musculoskeletal: He exhibits edema. He exhibits no tenderness.  edema bilateral bicep region, compartments still soft, passive range of motion without pain until  extremes of flexion, strong radial pulse, bilaterally, normal sensation  Neurological: He is alert. He has normal strength. No sensory deficit. Cranial nerve deficit:  no gross defecits noted. He exhibits normal muscle tone. He displays no seizure activity. Coordination normal.  Skin: Skin is warm and dry. No rash noted.  Psychiatric: He has a normal mood and affect.    ED Course  Procedures (including critical care time) Medications  0.9 %  sodium chloride infusion (not administered)    Followed by  0.9 %  sodium chloride infusion (not administered)    Followed by  0.9 %  sodium chloride infusion (not administered)  morphine 4 MG/ML injection 4 mg (not administered)  sodium chloride 0.9 % bolus 1,000 mL (not administered)    Labs Reviewed  CBC WITH DIFFERENTIAL - Abnormal; Notable for the following:    WBC 12.0 (*)     All other components within normal limits  COMPREHENSIVE METABOLIC PANEL - Abnormal; Notable for the following:    Glucose, Bld 119 (*)    AST 1037 (*)    ALT 231 (*)    GFR calc non Af Amer 79 (*)    All other components within normal limits  CK TOTAL AND CKMB - Abnormal; Notable for the following:    CK, MB 132.4 (*)    All other components within normal limits  URINALYSIS, ROUTINE W REFLEX MICROSCOPIC - Abnormal; Notable for the following:    Hgb urine dipstick LARGE (*)    Protein, ur 100 (*)    All other components within normal limits  URINE MICROSCOPIC-ADD ON   No results found.   1. Rhabdomyolysis       MDM  The patient's initial laboratory tests are consistent with recurrent rhabdomyolysis.  Patient does have tenderness in his bilateral upper extremities after his upper extremity weight workout however the symptoms are moderate. I doubt a compartment syndrome.  Patient will be started on aggressive IV hydration. He will be admitted to hospital for observation and close monitoring and serial creatinine kinase enzymes.        Celene Kras, MD 01/03/13 [phone]

## 2013-01-03 NOTE — H&P (Addendum)
PCP: Amador Cunas    Chief Complaint:  Dark urine  HPI: Benjamin Warner is a 32 y.o. male   has a past medical history of Rhabdomyolysis (8/03); Hypertension (approx 2005); Elevated CK; and Snoring.   Presented with  24 hours af muscle ache, fatigue and dark urine. He works out on a regular bases but have had some more exercises with his arms than usual although nothing out of the ordinary. IN the past he have had rhabdomyalisis and had a muscle biopsy that was normal. He presented to ER and was found to have CK >50000. With preserved cr. Patient feels he has not been drinking as much fluids as usual.  Hospitalist was calleld for admission.   Review of Systems:    Pertinent positives include:fatigue, muscle aches, dark urine  Constitutional:  No weight loss, night sweats, Fevers, chills,  weight loss  HEENT:  No headaches, Difficulty swallowing,Tooth/dental problems,Sore throat,  No sneezing, itching, ear ache, nasal congestion, post nasal drip,  Cardio-vascular:  No chest pain, Orthopnea, PND, anasarca, dizziness, palpitations.no Bilateral lower extremity swelling  GI:  No heartburn, indigestion, abdominal pain, nausea, vomiting, diarrhea, change in bowel habits, loss of appetite, melena, blood in stool, hematemesis Resp:  no shortness of breath at rest. No dyspnea on exertion, No excess mucus, no productive cough, No non-productive cough, No coughing up of blood.No change in color of mucus.No wheezing. Skin:  no rash or lesions. No jaundice GU:  no dysuria, change in color of urine, no urgency or frequency. No straining to urinate.  No flank pain.  Musculoskeletal:  No joint pain or no joint swelling. No decreased range of motion. No back pain.  Psych:  No change in mood or affect. No depression or anxiety. No memory loss.  Neuro: no localizing neurological complaints, no tingling, no weakness, no double vision, no gait abnormality, no slurred speech, no confusion  Otherwise  ROS are negative except for above, 10 systems were reviewed  Past Medical History: Past Medical History  Diagnosis Date  . Rhabdomyolysis 8/03    ck=20,000-unclear etiology  . Hypertension approx 2005  . Elevated CK     chronic, ~ 500, saw neuro, muscle Bx 12-2010: negative   . Snoring     sleep study 03-2010----> negative for sleep apnea, Rx wt loss     Past Surgical History  Procedure Laterality Date  . Wisdom tooth extraction    . Muscle biopsy    . Cyst excision      2012---Excision of right upper back and left lower back epidermoid       Medications: Prior to Admission medications   Medication Sig Start Date End Date Taking? Authorizing Provider  pantoprazole (PROTONIX) 40 MG tablet Take 1 tablet (40 mg total) by mouth daily. 12/30/12  Yes Gordy Savers, MD  Probiotic Product (PROBIOTIC DAILY PO) Take 1 tablet by mouth daily. For 7 days   Yes Historical Provider, MD  triamterene-hydrochlorothiazide (MAXZIDE-25) 37.5-25 MG per tablet Take 0.5 tablets by mouth daily. 12/30/12  Yes Gordy Savers, MD    Allergies:  No Known Allergies  Social History:  Ambulatory independently   Lives at  home   reports that he has quit smoking. He does not have any smokeless tobacco history on file. He reports that  drinks alcohol. He reports that he uses illicit drugs (Ketamine and MDMA (Ecstacy)).   Family History: family history includes Heart attack (age of onset: 90) in his father and Hypertension in his father  and mother.  There is no history of Hyperlipidemia, and Diabetes, and Sudden death, .    Physical Exam: Patient Vitals for the past 24 hrs:  BP Temp Temp src Pulse Resp SpO2  01/03/13 0315 139/82 mmHg - - 83 - 95 %  01/03/13 0300 147/80 mmHg - - 82 - 96 %  01/03/13 0245 152/86 mmHg - - 81 - 95 %  01/03/13 0230 155/96 mmHg - - 86 - 97 %  01/03/13 0215 152/89 mmHg - - 82 - 95 %  01/03/13 0200 150/99 mmHg - - 83 - 95 %  01/02/13 2339 155/83 mmHg 98.4 F (36.9 C)  Oral 98 16 98 %    1. General:  in No Acute distress 2. Psychological: Alert and  Oriented 3. Head/ENT:   Moist  Mucous Membranes                          Head Non traumatic, neck supple                          Normal  Dentition 4. SKIN: normal  Skin turgor,  Skin clean Dry and intact no rash 5. Heart: Regular rate and rhythm no Murmur, Rub or gallop 6. Lungs: Clear to auscultation bilaterally, no wheezes or crackles   7. Abdomen: Soft, non-tender, Non distended 8. Lower extremities: no clubbing, cyanosis, or edema 9. Neurologically Grossly intact, moving all 4 extremities equally 10. MSK: Normal range of motion, enlarged edematous biceps bilateraly  body mass index is unknown because there is no weight on file.   Labs on Admission:   Recent Labs  01/03/13 0001  NA 136  K 4.1  CL 97  CO2 27  GLUCOSE 119*  BUN 14  CREATININE 1.20  CALCIUM 9.6    Recent Labs  01/03/13 0001  AST 1037*  ALT 231*  ALKPHOS 63  BILITOT 0.5  PROT 7.2  ALBUMIN 4.0   No results found for this basename: LIPASE, AMYLASE,  in the last 72 hours  Recent Labs  01/03/13 0001  WBC 12.0*  NEUTROABS 7.6  HGB 15.5  HCT 43.7  MCV 81.7  PLT 227    Recent Labs  01/03/13 0001  CKTOTAL >50000*  CKMB 132.4*   No results found for this basename: TSH, T4TOTAL, FREET3, T3FREE, THYROIDAB,  in the last 72 hours No results found for this basename: VITAMINB12, FOLATE, FERRITIN, TIBC, IRON, RETICCTPCT,  in the last 72 hours No results found for this basename: HGBA1C    The CrCl is unknown because both a height and weight (above a minimum accepted value) are required for this calculation. ABG No results found for this basename: phart, pco2, po2, hco3, tco2, acidbasedef, o2sat     No results found for this basename: DDIMER    UA Hg High RBC 3-6, pH 6.0   Cultures: No results found for this basename: sdes, specrequest, cult, reptstatus       Radiological Exams on Admission: No results  found.  Chart has been reviewed  Assessment/Plan 32 yo M with rhabdomyolysis likely due to excessive exercise no evidence of ARF at this point.  Present on Admission:  . Rhabdomyolysis - Severely elevated CK, no evidence of renal insufficiency. Aggressive IV fluid resuscitation.  Renal consult in AM.Spoke to Renal will start on bicarb gtt D5 1/2 NS with 1 amp of bicarb at 150 ml/h for 1L and then switch back to  NS for 1 L after that would need to reassess with further bicarb would be needed. Follow Urine pH and serum bicarb level.  Marland Kitchen LIVER FUNCTION TESTS, ABNORMAL - likely due to dehydration, and muscle break down. Will need to follow up and makesure that the numbers do come down . HTN (hypertension) - will hold Maxide    Prophylaxis: SCD Protonix  CODE STATUS:FULL CODE  Other plan as per orders.  I have spent a total of 55 min on this admission  Jalynn Waddell 01/03/2013, 3:53 AM

## 2013-01-03 NOTE — Consult Note (Signed)
Referring Provider: No ref. provider found Primary Care Physician:  Willow Ora, MD Primary Nephrologist:  none  Reason for Consultation:  Elevated CPK HPI: 24 hours af muscle ache, fatigue and dark urine.He presented to ER and was found to have CK >50000. IN the past he have had rhabdomyolysis .He works out on a regular bases but have had some more exercises with his arms than usual although nothing out of the ordinary. Drives long distant truck driver. Complains of pain over biceps with acute arm edema in right and left arm and forearm.      Past Medical History  Diagnosis Date  . Rhabdomyolysis 8/03    ck=20,000-unclear etiology  . Hypertension approx 2005  . Elevated CK     chronic, ~ 500, saw neuro, muscle Bx 12-2010: negative   . Snoring     sleep study 03-2010----> negative for sleep apnea, Rx wt loss      Past Surgical History  Procedure Laterality Date  . Wisdom tooth extraction    . Muscle biopsy    . Cyst excision      2012---Excision of right upper back and left lower back epidermoid      Prior to Admission medications   Medication Sig Start Date End Date Taking? Authorizing Provider  pantoprazole (PROTONIX) 40 MG tablet Take 1 tablet (40 mg total) by mouth daily. 12/30/12  Yes Gordy Savers, MD  Probiotic Product (PROBIOTIC DAILY PO) Take 1 tablet by mouth daily. For 7 days   Yes Historical Provider, MD  triamterene-hydrochlorothiazide (MAXZIDE-25) 37.5-25 MG per tablet Take 0.5 tablets by mouth daily. 12/30/12  Yes Gordy Savers, MD    Current Facility-Administered Medications  Medication Dose Route Frequency Provider Last Rate Last Dose  . 0.9 %  sodium chloride infusion   Intravenous Continuous Therisa Doyne, MD      . acetaminophen (TYLENOL) tablet 650 mg  650 mg Oral Q6H PRN Therisa Doyne, MD       Or  . acetaminophen (TYLENOL) suppository 650 mg  650 mg Rectal Q6H PRN Therisa Doyne, MD      . HYDROcodone-acetaminophen (NORCO/VICODIN)  5-325 MG per tablet 1-2 tablet  1-2 tablet Oral Q4H PRN Therisa Doyne, MD      . morphine 4 MG/ML injection 4 mg  4 mg Intravenous Q1H PRN Celene Kras, MD   4 mg at 01/03/13 0148  . ondansetron (ZOFRAN) tablet 4 mg  4 mg Oral Q6H PRN Therisa Doyne, MD       Or  . ondansetron (ZOFRAN) injection 4 mg  4 mg Intravenous Q6H PRN Therisa Doyne, MD      . sodium chloride 0.9 % injection 3 mL  3 mL Intravenous Q12H Therisa Doyne, MD   3 mL at 01/03/13 1000    Allergies as of 01/02/2013  . (No Known Allergies)    Family History  Problem Relation Age of Onset  . Hypertension Mother   . Heart attack Father 41  . Hypertension Father   . Hyperlipidemia Neg Hx   . Diabetes Neg Hx   . Sudden death Neg Hx     History   Social History  . Marital Status: Married    Spouse Name: N/A    Number of Children: 1  . Years of Education: N/A   Occupational History  .     Social History Main Topics  . Smoking status: Former Games developer  . Smokeless tobacco: Not on file  . Alcohol Use: Yes  Comment: WEEKENDS  . Drug Use: Yes    Special: Ketamine, MDMA (Ecstacy)     Comment: 10 years ago  . Sexually Active: Not on file   Other Topics Concern  . Not on file   Social History Narrative   Tobacco -- smoked "socially" as a teenage x 2-3 years.  Nonsmoker at present   Diet -- avoiding fast food but still eats high in fat   Exercise -- very active at work    Review of Systems: Gen: Denies any fever, chills, sweats, anorexia, fatigue, weakness, malaise, weight loss, and sleep disorder HEENT: No visual complaints, No history of Retinopathy. Normal external appearance No Epistaxis or Sore throat. No sinusitis.   CV: Denies chest pain, angina, palpitations, syncope, orthopnea, PND, peripheral edema, and claudication. Resp: Denies dyspnea at rest, dyspnea with exercise, cough, sputum, wheezing, coughing up blood, and pleurisy. GI: Denies vomiting blood, jaundice, and fecal  incontinence.   Denies dysphagia or odynophagia. GU : Denies urinary burning, blood in urine, urinary frequency, urinary hesitancy, nocturnal urination, and urinary incontinence.  No renal calculi. AO:ZHYQ and swelling in arms tense muscle groups. No edema Derm: Denies rash, itching, dry skin, hives, moles, warts, or unhealing ulcers.  Psych: Denies depression, anxiety, memory loss, suicidal ideation, hallucinations, paranoia, and confusion. Heme: Denies bruising, bleeding, and enlarged lymph nodes. Neuro: No headache.  No diplopia. No dysarthria.  No dysphasia.  No history of CVA.  No Seizures. No paresthesias.  No weakness. Endocrine No DM.  No Thyroid disease.  No Adrenal disease.  Physical Exam: Vital signs in last 24 hours: Temp:  [97.2 F (36.2 C)-98.4 F (36.9 C)] 97.2 F (36.2 C) (06/13 0651) Pulse Rate:  [69-100] 76 (06/13 0651) Resp:  [16] 16 (06/12 2339) BP: (111-161)/(68-99) 137/85 mmHg (06/13 0651) SpO2:  [95 %-100 %] 100 % (06/13 0651) Weight:  [124.6 kg (274 lb 11.1 oz)] 124.6 kg (274 lb 11.1 oz) (06/13 0651) Last BM Date: 01/02/13 General:   Alert,  Well-developed, well-nourished, pleasant and cooperative in NAD Head:  Normocephalic and atraumatic. Eyes:  Sclera clear, no icterus.   Conjunctiva pink. Ears:  Normal auditory acuity. Nose:  No deformity, discharge,  or lesions. Mouth:  No deformity or lesions, dentition normal. Neck:  Supple; no masses or thyromegaly. JVP not elevated Lungs:  Clear throughout to auscultation.   No wheezes, crackles, or rhonchi. No acute distress. Heart:  Regular rate and rhythm; no murmurs, clicks, rubs,  or gallops. Abdomen:  Soft, nontender and nondistended. No masses, hepatosplenomegaly or hernias noted. Normal bowel sounds, without guarding, and without rebound.   Msk:  Symmetrical without gross deformities. Normal posture. Pulses:  No carotid, renal, femoral bruits. DP and PT symmetrical and equal Extremities:  Without clubbing or  edema. Neurologic:  Alert and  oriented x4;  grossly normal neurologically. Skin:  Intact without significant lesions or rashes. Cervical Nodes:  No significant cervical adenopathy. Psych:  Alert and cooperative. Normal mood and affect.  Intake/Output from previous day: 06/12 0701 - 06/13 0700 In: 4000 [I.V.:4000] Out: 1400 [Urine:1400] Intake/Output this shift: Total I/O In: 240 [P.O.:240] Out: 650 [Urine:650]  Lab Results:  Recent Labs  01/03/13 0001  WBC 12.0*  HGB 15.5  HCT 43.7  PLT 227   BMET  Recent Labs  01/03/13 0001 01/03/13 0750  NA 136 139  K 4.1 3.9  CL 97 106  CO2 27 26  GLUCOSE 119* 100*  BUN 14 11  CREATININE 1.20 1.13  CALCIUM 9.6 8.3*  PHOS  --  2.6   LFT  Recent Labs  01/03/13 0750  PROT 6.2  ALBUMIN 3.3*  AST 905*  ALT 229*  ALKPHOS 53  BILITOT 0.4   PT/INR No results found for this basename: LABPROT, INR,  in the last 72 hours Hepatitis Panel No results found for this basename: HEPBSAG, HCVAB, HEPAIGM, HEPBIGM,  in the last 72 hours  Studies/Results: No results found.  Assessment/Plan:  Rhabdomyolysis. Secondary to isometric exercise and dehydration. I doubt this represents an underlying connective tissue disorder. It appears clearly related to repetitive injury to muscles.  Volume  Appears volume depletion continue IV fluids  HTN controlled  No evidence of renal failure at present.   LOS: 0 Keyanna Sandefer W @TODAY @11 :01 AM

## 2013-01-03 NOTE — Progress Notes (Signed)
Utilization review completed. Francesa Eugenio, RN, BSN. 

## 2013-01-03 NOTE — Care Management Note (Unsigned)
    Page 1 of 1   01/03/2013     3:49:23 PM   CARE MANAGEMENT NOTE 01/03/2013  Patient:  Benjamin Warner, Benjamin Warner   Account Number:  [id number]  Date Initiated:  01/03/2013  Documentation initiated by:  Jenaye Rickert  Subjective/Objective Assessment:   PT ADM ON 01/02/13 WITH RHABDOMYLOSIS.  PTA, PT INDEPENDENT, LIVES WITH SPOUSE.     Action/Plan:   WILL FOLLOW FOR HOME NEEDS AS PT PROGRESSES.   Anticipated DC Date:  01/05/2013   Anticipated DC Plan:  HOME/SELF CARE      DC Planning Services  CM consult      Choice offered to / List presented to:             Status of service:  In process, will continue to follow Medicare Important Message given?   (If response is "NO", the following Medicare IM given date fields will be blank) Date Medicare IM given:   Date Additional Medicare IM given:    Discharge Disposition:    Per UR Regulation:  Reviewed for med. necessity/level of care/duration of stay  If discussed at Long Length of Stay Meetings, dates discussed:    Comments:

## 2013-01-04 DIAGNOSIS — K219 Gastro-esophageal reflux disease without esophagitis: Secondary | ICD-10-CM

## 2013-01-04 DIAGNOSIS — M6282 Rhabdomyolysis: Principal | ICD-10-CM

## 2013-01-04 DIAGNOSIS — I10 Essential (primary) hypertension: Secondary | ICD-10-CM

## 2013-01-04 DIAGNOSIS — R945 Abnormal results of liver function studies: Secondary | ICD-10-CM

## 2013-01-04 LAB — CBC
MCH: 28.1 pg (ref 26.0–34.0)
MCV: 83.1 fL (ref 78.0–100.0)
Platelets: 207 10*3/uL (ref 150–400)
RBC: 4.84 MIL/uL (ref 4.22–5.81)
RDW: 14.2 % (ref 11.5–15.5)

## 2013-01-04 LAB — URINE CULTURE
Colony Count: NO GROWTH
Culture: NO GROWTH

## 2013-01-04 LAB — URINALYSIS, ROUTINE W REFLEX MICROSCOPIC
Bilirubin Urine: NEGATIVE
Ketones, ur: NEGATIVE mg/dL
Protein, ur: NEGATIVE mg/dL
Urobilinogen, UA: 0.2 mg/dL (ref 0.0–1.0)

## 2013-01-04 LAB — URINE MICROSCOPIC-ADD ON

## 2013-01-04 LAB — COMPREHENSIVE METABOLIC PANEL
Albumin: 3.3 g/dL — ABNORMAL LOW (ref 3.5–5.2)
BUN: 11 mg/dL (ref 6–23)
Chloride: 103 mEq/L (ref 96–112)
Creatinine, Ser: 1.11 mg/dL (ref 0.50–1.35)
GFR calc non Af Amer: 86 mL/min — ABNORMAL LOW (ref >90)
Total Bilirubin: 0.6 mg/dL (ref 0.3–1.2)

## 2013-01-04 MED ORDER — DEXTROSE 5 % IV SOLN
INTRAVENOUS | Status: DC
  Administered 2013-01-04 – 2013-01-05 (×2): via INTRAVENOUS
  Filled 2013-01-04 (×3): qty 150

## 2013-01-04 MED ORDER — CLONIDINE HCL 0.1 MG PO TABS
0.1000 mg | ORAL_TABLET | Freq: Three times a day (TID) | ORAL | Status: DC | PRN
  Filled 2013-01-04: qty 1

## 2013-01-04 NOTE — Progress Notes (Signed)
[facility] PROGRESS NOTE  PEARLEY BARANEK WGN:[id number] DOB: 06-29-1981 DOA: 01/03/2013 PCP: Willow Ora, MD  Assessment/Plan: . Rhabdomyolysis  - Severely elevated CK, no evidence of renal insufficiency.  -continueAggressive IV fluid resuscitation- Renal recommending to continue bicarb gtt D5 1/2 NS with 1 amp of bicarb at 125 ml/h for now, follow serum bicarb level. Urine ph remains at 6.0 and serum co2 27 . LIVER FUNCTION TESTS, ABNORMAL/Transaminitis - Lfts slightly up today, await hepatitis panel and obtain US.no listed causative med . HTN (hypertension) - holding Maxide, clonidine prn    Code Status: full Family Communication: none at bedside Disposition Plan: to home when medically stable   Consultants:  renal  Procedures:  none  Antibiotics:  none  HPI/Subjective: States still sore and tight over biceps. He denies CP and no abd pain  Objective: Filed Vitals:   01/03/13 0651 01/03/13 1300 01/03/13 1933 01/04/13 0352  BP: 137/85 124/75 135/74 135/58  Pulse: 76 76 83 73  Temp: 97.2 F (36.2 C) 97.9 F (36.6 C) 98.4 F (36.9 C) 98.5 F (36.9 C)  TempSrc: Oral Oral Oral Oral  Resp:  18 18 19   Height: 5\' 10"  (1.778 m)     Weight: 124.6 kg (274 lb 11.1 oz)     SpO2: 100% 100% 99% 98%    Intake/Output Summary (Last 24 hours) at 01/04/13 1144 Last data filed at 01/04/13 0800  Gross per 24 hour  Intake    720 ml  Output   1851 ml  Net  -1131 ml   Filed Weights   01/03/13 0651  Weight: 124.6 kg (274 lb 11.1 oz)    Exam:   General:  Alert and oriented x3, in NAD  Cardiovascular: RRR  Respiratory: CTAB  Abdomen: soft +BS NT/ND  Extremities: No cyanosis and no edema  Data Reviewed: Basic Metabolic Panel:  Recent Labs Lab 01/03/13 0001 01/03/13 0750 01/03/13 1556 01/03/13 2236 01/04/13 0410  NA 136 139 138 138 137  K 4.1 3.9 3.8 3.9 4.2  CL 97 106 104 103 103  CO2 27 26 27 26 27   GLUCOSE 119* 100* 105* 90 86  BUN 14 11 13 13 11    CREATININE 1.20 1.13 1.08 1.08 1.11  CALCIUM 9.6 8.3* 8.6 8.9 9.0  MG  --  1.9  --   --   --   PHOS  --  2.6  --   --   --    Liver Function Tests:  Recent Labs Lab 01/03/13 0001 01/03/13 0750 01/04/13 0410  AST 1037* 905* 974*  ALT 231* 229* 270*  ALKPHOS 63 53 49  BILITOT 0.5 0.4 0.6  PROT 7.2 6.2 6.3  ALBUMIN 4.0 3.3* 3.3*   No results found for this basename: LIPASE, AMYLASE,  in the last 168 hours No results found for this basename: AMMONIA,  in the last 168 hours CBC:  Recent Labs Lab 01/03/13 0001 01/04/13 0410  WBC 12.0* 9.4  NEUTROABS 7.6  --   HGB 15.5 13.6  HCT 43.7 40.2  MCV 81.7 83.1  PLT 227 207   Cardiac Enzymes:  Recent Labs Lab 12/30/12 1037 01/03/13 0001 01/03/13 0750 01/03/13 1334 01/03/13 1903 01/04/13 0125 01/04/13 0410  CKTOTAL 664* >50000* >50000* >50000* >50000* >50000* >50000*  CKMB 9.6* 132.4*  --   --   --   --   --    BNP (last 3 results) No results found for this basename: PROBNP,  in the last 8760 hours CBG: No results  found for this basename: GLUCAP,  in the last 168 hours  No results found for this or any previous visit (from the past 240 hour(s)).   Studies: No results found.  Scheduled Meds: . sodium chloride  3 mL Intravenous Q12H   Continuous Infusions: .  sodium bicarbonate  infusion 1000 mL 75 mL/hr at 01/04/13 0800    Active Problems:   LIVER FUNCTION TESTS, ABNORMAL   HTN (hypertension)   Rhabdomyolysis    Time spent: 25    Ms [facility]  [facility] Pager [phone]. If 7PM-7AM, please contact night-coverage at [email or web address], password [facility] 01/04/2013, 11:44 AM  LOS: 1 day

## 2013-01-04 NOTE — Progress Notes (Signed)
[facility] ROUNDING NOTE   Subjective:   Interval History: appears comfortable less discomfort  Objective:  Vital signs in last 24 hours:  Temp:  [97.9 F (36.6 C)-98.5 F (36.9 C)] 98.5 F (36.9 C) (06/14 0352) Pulse Rate:  [73-83] 73 (06/14 0352) Resp:  [18-19] 19 (06/14 0352) BP: (124-135)/(58-75) 135/58 mmHg (06/14 0352) SpO2:  [98 %-100 %] 98 % (06/14 0352)  Weight change:  Filed Weights   01/03/13 0651  Weight: 124.6 kg (274 lb 11.1 oz)    Intake/Output: I/O last 3 completed shifts: In: 4720 [P.O.:720; I.V.:4000] Out: 3901 [Urine:3900; Stool:1]   Intake/Output this shift:  Total I/O In: 240 [P.O.:240] Out: -   CVS- RRR RS- CTA ABD- BS present soft non-distended EXT- no edema   Basic Metabolic Panel:  Recent Labs Lab 01/03/13 0001 01/03/13 0750 01/03/13 1556 01/03/13 2236 01/04/13 0410  NA 136 139 138 138 137  K 4.1 3.9 3.8 3.9 4.2  CL 97 106 104 103 103  CO2 27 26 27 26 27   GLUCOSE 119* 100* 105* 90 86  BUN 14 11 13 13 11   CREATININE 1.20 1.13 1.08 1.08 1.11  CALCIUM 9.6 8.3* 8.6 8.9 9.0  MG  --  1.9  --   --   --   PHOS  --  2.6  --   --   --     Liver Function Tests:  Recent Labs Lab 01/03/13 0001 01/03/13 0750 01/04/13 0410  AST 1037* 905* 974*  ALT 231* 229* 270*  ALKPHOS 63 53 49  BILITOT 0.5 0.4 0.6  PROT 7.2 6.2 6.3  ALBUMIN 4.0 3.3* 3.3*   No results found for this basename: LIPASE, AMYLASE,  in the last 168 hours No results found for this basename: AMMONIA,  in the last 168 hours  CBC:  Recent Labs Lab 01/03/13 0001 01/04/13 0410  WBC 12.0* 9.4  NEUTROABS 7.6  --   HGB 15.5 13.6  HCT 43.7 40.2  MCV 81.7 83.1  PLT 227 207    Cardiac Enzymes:  Recent Labs Lab 12/30/12 1037 01/03/13 0001 01/03/13 0750 01/03/13 1334 01/03/13 1903 01/04/13 0125 01/04/13 0410  CKTOTAL 664* >50000* >50000* >50000* >50000* >50000* >50000*  CKMB 9.6* 132.4*  --   --   --   --   --     BNP: No components  found with this basename: POCBNP,   CBG: No results found for this basename: GLUCAP,  in the last 168 hours  Microbiology: No results found for this or any previous visit.  Coagulation Studies: No results found for this basename: LABPROT, INR,  in the last 72 hours  Urinalysis:  Recent Labs  01/03/13 1800 01/04/13 0200  COLORURINE YELLOW YELLOW  LABSPEC 1.013 1.011  PHURINE 6.0 6.0  GLUCOSEU NEGATIVE NEGATIVE  HGBUR LARGE* MODERATE*  BILIRUBINUR NEGATIVE NEGATIVE  KETONESUR NEGATIVE NEGATIVE  PROTEINUR NEGATIVE NEGATIVE  UROBILINOGEN 0.2 0.2  NITRITE NEGATIVE NEGATIVE  LEUKOCYTESUR NEGATIVE NEGATIVE      Imaging: No results found.   Medications:   .  sodium bicarbonate  infusion 1000 mL 75 mL/hr at 01/04/13 0800   . sodium chloride  3 mL Intravenous Q12H   acetaminophen, acetaminophen, HYDROcodone-acetaminophen, morphine injection, ondansetron (ZOFRAN) IV, ondansetron  Assessment/ Plan:   Rhabdomyolysis  With CPK still evalvated  Above 50,000. Continue IV bicarbonate infusion at 125 cc/min  HTN controlled of antihypertensives  Would recommend continuing IV bicarbonate at present.  Follow up renal panel in AM   LOS:  1 Chey Cho W @TODAY @10 :26 AM

## 2013-01-05 ENCOUNTER — Inpatient Hospital Stay (HOSPITAL_COMMUNITY): Payer: BC Managed Care – PPO

## 2013-01-05 LAB — COMPREHENSIVE METABOLIC PANEL
BUN: 13 mg/dL (ref 6–23)
CO2: 33 mEq/L — ABNORMAL HIGH (ref 19–32)
Calcium: 9.4 mg/dL (ref 8.4–10.5)
Creatinine, Ser: 1.12 mg/dL (ref 0.50–1.35)
GFR calc Af Amer: >90 mL/min (ref >90)
GFR calc non Af Amer: 86 mL/min — ABNORMAL LOW (ref >90)
Glucose, Bld: 96 mg/dL (ref 70–99)
Total Protein: 6.5 g/dL (ref 6.0–8.3)

## 2013-01-05 LAB — HEPATITIS PANEL, ACUTE
Hep A IgM: NEGATIVE
Hep B C IgM: NEGATIVE
Hepatitis B Surface Ag: NEGATIVE

## 2013-01-05 MED ORDER — SODIUM BICARBONATE 8.4 % IV SOLN
INTRAVENOUS | Status: DC
  Filled 2013-01-05 (×2): qty 50

## 2013-01-05 MED ORDER — SODIUM CHLORIDE 0.9 % IV SOLN
INTRAVENOUS | Status: DC
  Administered 2013-01-05 – 2013-01-06 (×2): via INTRAVENOUS

## 2013-01-05 NOTE — Progress Notes (Addendum)
[facility] PROGRESS NOTE  Benjamin Warner AVW:[id number] DOB: 1980-10-17 DOA: 01/03/2013 PCP: Willow Ora, MD  Assessment/Plan: . Rhabdomyolysis  - Severely elevated CK, no evidence of renal insufficiency.  -continue aggressive IV fluid resuscitation- Renal recommending to continue bicarb gtt D5 1/2 NS with 1 amp of bicarb at 125 ml/h on 6/14, with . Urine ph remains at 6.0 and serum co2 27. Today serum Co2 33>> changed IVF to NS, follow -Ck trending down but still elevated, follow and recheck in am . LIVER FUNCTION TESTS, ABNORMAL/Transaminitis - Lfts trending down, acute hepatitis panel neg and [place] with hepatic steatosis -pt admits to drinking 6-12beers about 3days per wk, denies h/o DTs -counseled to quit alcohol . HTN (hypertension) - holding Maxide, clonidine prn  .Alcohol use -counseled to quit -add mvi, thiamine and folate, prn ativa  Code Status: full Family Communication: none at bedside Disposition Plan: to home when medically stable   Consultants:  Renal - signed off 6/14  Procedures:  none  Antibiotics:  none  HPI/Subjective: States upper arm soreness less, denies any c/o  Objective: Filed Vitals:   01/04/13 0352 01/04/13 1335 01/04/13 2042 01/05/13 0403  BP: 135/58 155/76 147/79 147/76  Pulse: 73 84 81 75  Temp: 98.5 F (36.9 C) 98.5 F (36.9 C) 97.5 F (36.4 C) 97.9 F (36.6 C)  TempSrc: Oral Oral Oral Oral  Resp: 19 18 18 18   Height:      Weight:      SpO2: 98% 97% 97% 98%    Intake/Output Summary (Last 24 hours) at 01/05/13 0849 Last data filed at 01/04/13 1800  Gross per 24 hour  Intake    480 ml  Output      0 ml  Net    480 ml   Filed Weights   01/03/13 0651  Weight: 124.6 kg (274 lb 11.1 oz)    Exam:   General:  Alert and oriented x3, in NAD  Cardiovascular: RRR  Respiratory: CTAB  Abdomen: soft +BS NT/ND  Extremities: No cyanosis and no edema, no tremors  Data Reviewed: Basic Metabolic Panel:  Recent Labs Lab  01/03/13 0001 01/03/13 0750 01/03/13 1556 01/03/13 2236 01/04/13 0410 01/05/13 0428  NA 136 139 138 138 137 139  K 4.1 3.9 3.8 3.9 4.2 3.6  CL 97 106 104 103 103 98  CO2 27 26 27 26 27  33*  GLUCOSE 119* 100* 105* 90 86 96  BUN 14 11 13 13 11 13   CREATININE 1.20 1.13 1.08 1.08 1.11 1.12  CALCIUM 9.6 8.3* 8.6 8.9 9.0 9.4  MG  --  1.9  --   --   --   --   PHOS  --  2.6  --   --   --   --    Liver Function Tests:  Recent Labs Lab 01/03/13 0001 01/03/13 0750 01/04/13 0410 01/05/13 0428  AST 1037* 905* 974* 762*  ALT 231* 229* 270* 271*  ALKPHOS 63 53 49 51  BILITOT 0.5 0.4 0.6 0.4  PROT 7.2 6.2 6.3 6.5  ALBUMIN 4.0 3.3* 3.3* 3.4*   No results found for this basename: LIPASE, AMYLASE,  in the last 168 hours No results found for this basename: AMMONIA,  in the last 168 hours CBC:  Recent Labs Lab 01/03/13 0001 01/04/13 0410  WBC 12.0* 9.4  NEUTROABS 7.6  --   HGB 15.5 13.6  HCT 43.7 40.2  MCV 81.7 83.1  PLT 227 207   Cardiac Enzymes:  Recent  Labs Lab 12/30/12 1037 01/03/13 0001  01/03/13 1334 01/03/13 1903 01/04/13 0125 01/04/13 0410 01/05/13 0428  CKTOTAL 664* >50000*  < > >50000* >50000* >50000* >50000* 49791*  CKMB 9.6* 132.4*  --   --   --   --   --   --   < > = values in this interval not displayed. BNP (last 3 results) No results found for this basename: PROBNP,  in the last 8760 hours CBG: No results found for this basename: GLUCAP,  in the last 168 hours  Recent Results (from the past 240 hour(s))  URINE CULTURE     Status: None   Collection Time    01/03/13  8:44 AM      Result Value Range Status   Specimen Description URINE, RANDOM   Final   Special Requests ADDED [id number]   Final   Culture  Setup Time 01/03/2013 19:01   Final   Colony Count NO GROWTH   Final   Culture NO GROWTH   Final   Report Status 01/04/2013 FINAL   Final     Studies: No results found.  Scheduled Meds: . sodium chloride  3 mL Intravenous Q12H    Continuous Infusions: .  sodium bicarbonate  infusion 1000 mL      Active Problems:   LIVER FUNCTION TESTS, ABNORMAL   HTN (hypertension)   Rhabdomyolysis    Time spent: 25    [facility] C  [facility] Pager [phone]. If 7PM-7AM, please contact night-coverage at [email or web address], password [facility] 01/05/2013, 8:49 AM  LOS: 2 days

## 2013-01-06 DIAGNOSIS — R945 Abnormal results of liver function studies: Secondary | ICD-10-CM

## 2013-01-06 DIAGNOSIS — M6282 Rhabdomyolysis: Secondary | ICD-10-CM

## 2013-01-06 DIAGNOSIS — I10 Essential (primary) hypertension: Secondary | ICD-10-CM

## 2013-01-06 LAB — COMPREHENSIVE METABOLIC PANEL
ALT: 248 U/L — ABNORMAL HIGH (ref 0–53)
AST: 464 U/L — ABNORMAL HIGH (ref 0–37)
Albumin: 3.4 g/dL — ABNORMAL LOW (ref 3.5–5.2)
CO2: 28 mEq/L (ref 19–32)
Chloride: 102 mEq/L (ref 96–112)
GFR calc non Af Amer: 83 mL/min — ABNORMAL LOW (ref >90)
Sodium: 139 mEq/L (ref 135–145)
Total Bilirubin: 0.2 mg/dL — ABNORMAL LOW (ref 0.3–1.2)

## 2013-01-06 LAB — CK: Total CK: 15327 U/L — ABNORMAL HIGH (ref 7–232)

## 2013-01-06 MED ORDER — ADULT MULTIVITAMIN W/MINERALS CH
1.0000 | ORAL_TABLET | Freq: Every day | ORAL | Status: DC
  Administered 2013-01-06 – 2013-01-07 (×2): 1 via ORAL
  Filled 2013-01-06 (×2): qty 1

## 2013-01-06 MED ORDER — VITAMIN B-1 100 MG PO TABS
100.0000 mg | ORAL_TABLET | Freq: Every day | ORAL | Status: DC
  Administered 2013-01-06 – 2013-01-07 (×2): 100 mg via ORAL
  Filled 2013-01-06 (×2): qty 1

## 2013-01-06 MED ORDER — FOLIC ACID 1 MG PO TABS
1.0000 mg | ORAL_TABLET | Freq: Every day | ORAL | Status: DC
  Administered 2013-01-06 – 2013-01-07 (×2): 1 mg via ORAL
  Filled 2013-01-06 (×2): qty 1

## 2013-01-06 MED ORDER — LORAZEPAM 2 MG/ML IJ SOLN: 1.0000 mg | INTRAMUSCULAR | Status: DC | PRN

## 2013-01-06 NOTE — Progress Notes (Signed)
[facility] PROGRESS NOTE  Benjamin Warner WJX:[id number] DOB: 09/01/1980 DOA: 01/03/2013 PCP: Willow Ora, MD  Assessment/Plan: . Rhabdomyolysis  - Severely elevated CK, no evidence of renal insufficiency.  -continue aggressive IV fluid resuscitation- Renal recommending to continue bicarb gtt D5 1/2 NS with 1 amp of bicarb at 125 ml/h on 6/14, with . Urine ph remains at 6.0 and serum CO2 27. Today serum Co2 33>> changed IVF to NS, follow -Ck down to 19,000 today, continue hydration with normal saline. Discuss with renal and they recommend to consider DC once CK down to less than 10,000 his renal function remains within normal . LIVER FUNCTION TESTS, ABNORMAL/Transaminitis - Lfts continuing to trend down, acute hepatitis panel neg and [place] with hepatic steatosis -pt admits to drinking 6-12beers about 3days per wk, denies h/o DTs -counseled to quit alcohol . HTN (hypertension) - holding Maxide, clonidine prn  .Alcohol use -counseled to quit -coninue mvi, thiamine and folate, prn ativa  Code Status: full Family Communication: none at bedside Disposition Plan: to home when medically stable   Consultants:  Renal - signed off 6/14  Procedures:  none  Antibiotics:  none  HPI/Subjective: Denies any new complaints, myalgias improving  Objective: Filed Vitals:   01/04/13 2042 01/05/13 0403 01/05/13 1928 01/06/13 0350  BP: 147/79 147/76 121/78 136/83  Pulse: 81 75 73 76  Temp: 97.5 F (36.4 C) 97.9 F (36.6 C) 98.4 F (36.9 C) 98.2 F (36.8 C)  TempSrc: Oral Oral Oral Oral  Resp: 18 18 19 20   Height:      Weight:      SpO2: 97% 98% 99% 98%    Intake/Output Summary (Last 24 hours) at 01/06/13 0853 Last data filed at 01/06/13 0600  Gross per 24 hour  Intake 3607.5 ml  Output    400 ml  Net 3207.5 ml   Filed Weights   01/03/13 0651  Weight: 124.6 kg (274 lb 11.1 oz)    Exam:   General:  Alert and oriented x3, in NAD  Cardiovascular: RRR  Respiratory:  CTAB  Abdomen: soft +BS NT/ND  Extremities: No cyanosis and no edema, no tremors  Data Reviewed: Basic Metabolic Panel:  Recent Labs Lab 01/03/13 0001 01/03/13 0750 01/03/13 1556 01/03/13 2236 01/04/13 0410 01/05/13 0428 01/06/13 0445  NA 136 139 138 138 137 139 139  K 4.1 3.9 3.8 3.9 4.2 3.6 4.2  CL 97 106 104 103 103 98 102  CO2 27 26 27 26 27  33* 28  GLUCOSE 119* 100* 105* 90 86 96 82  BUN 14 11 13 13 11 13 13   CREATININE 1.20 1.13 1.08 1.08 1.11 1.12 1.15  CALCIUM 9.6 8.3* 8.6 8.9 9.0 9.4 9.1  MG  --  1.9  --   --   --   --   --   PHOS  --  2.6  --   --   --   --   --    Liver Function Tests:  Recent Labs Lab 01/03/13 0001 01/03/13 0750 01/04/13 0410 01/05/13 0428 01/06/13 0445  AST 1037* 905* 974* 762* 464*  ALT 231* 229* 270* 271* 248*  ALKPHOS 63 53 49 51 52  BILITOT 0.5 0.4 0.6 0.4 0.2*  PROT 7.2 6.2 6.3 6.5 6.6  ALBUMIN 4.0 3.3* 3.3* 3.4* 3.4*   No results found for this basename: LIPASE, AMYLASE,  in the last 168 hours No results found for this basename: AMMONIA,  in the last 168 hours CBC:  Recent Labs  Lab 01/03/13 0001 01/04/13 0410  WBC 12.0* 9.4  NEUTROABS 7.6  --   HGB 15.5 13.6  HCT 43.7 40.2  MCV 81.7 83.1  PLT 227 207   Cardiac Enzymes:  Recent Labs Lab 12/30/12 1037 01/03/13 0001  01/03/13 1903 01/04/13 0125 01/04/13 0410 01/05/13 0428 01/06/13 0445  CKTOTAL 664* >50000*  < > >50000* >50000* >50000* 49791* 19455*  CKMB 9.6* 132.4*  --   --   --   --   --   --   < > = values in this interval not displayed. BNP (last 3 results) No results found for this basename: PROBNP,  in the last 8760 hours CBG: No results found for this basename: GLUCAP,  in the last 168 hours  Recent Results (from the past 240 hour(s))  URINE CULTURE     Status: None   Collection Time    01/03/13  8:44 AM      Result Value Range Status   Specimen Description URINE, RANDOM   Final   Special Requests ADDED [id number]   Final   Culture  Setup  Time 01/03/2013 19:01   Final   Colony Count NO GROWTH   Final   Culture NO GROWTH   Final   Report Status 01/04/2013 FINAL   Final     Studies: US Abdomen Complete  01/05/2013   *RADIOLOGY REPORT*  Clinical Data:  Elevated LFTs.  COMPLETE ABDOMINAL ULTRASOUND  Comparison:  None.  Findings:  Gallbladder:  No gallstones, gallbladder wall thickening, or pericholecystic fluid.  Common bile duct:  Measures 0.6 cm.  Liver:  The liver parenchyma is slightly echogenic.  No focal liver abnormality.  Main portal vein is patent.  IVC:  Not well identified.  Pancreas:  Not visualized.  Spleen:  Poorly visualized.  Measures 6.1 cm in length.  Right Kidney:  Right kidney measures 11.6 cm in length.  Negative for hydronephrosis.  Lower pole is not well visualized due to bowel gas.  Left Kidney:  Left kidney measures 12.4 cm in length without hydronephrosis.  Abdominal aorta:  No aneurysm identified.  IMPRESSION: No acute abnormalities.  The liver parenchyma is echogenic.  Findings could be associated with hepatic steatosis.  Bowel gas limits evaluation of certain areas as described.   Original Report Authenticated By: Richarda Overlie, M.D.    Scheduled Meds: . folic acid  1 mg Oral Daily  . multivitamin with minerals  1 tablet Oral Daily  . sodium chloride  3 mL Intravenous Q12H  . thiamine  100 mg Oral Daily   Continuous Infusions: . sodium chloride 150 mL/hr at 01/05/13 2041    Active Problems:   LIVER FUNCTION TESTS, ABNORMAL   HTN (hypertension)   Rhabdomyolysis    Time spent: 25    [facility] C  [facility] Pager [phone]. If 7PM-7AM, please contact night-coverage at [email or web address], password [facility] 01/06/2013, 8:53 AM  LOS: 3 days

## 2013-01-07 LAB — BASIC METABOLIC PANEL
CO2: 28 mEq/L (ref 19–32)
Calcium: 9 mg/dL (ref 8.4–10.5)
Chloride: 102 mEq/L (ref 96–112)
Glucose, Bld: 90 mg/dL (ref 70–99)
Sodium: 138 mEq/L (ref 135–145)

## 2013-01-07 LAB — CK: Total CK: 7653 U/L — ABNORMAL HIGH (ref 7–232)

## 2013-01-07 MED ORDER — ADULT MULTIVITAMIN W/MINERALS CH: 1.0000 | ORAL_TABLET | Freq: Every day | ORAL | Status: DC

## 2013-01-07 MED ORDER — FOLIC ACID 1 MG PO TABS: 1.0000 mg | ORAL_TABLET | Freq: Every day | ORAL | Status: DC

## 2013-01-07 MED ORDER — THIAMINE HCL 100 MG PO TABS: 100.0000 mg | ORAL_TABLET | Freq: Every day | ORAL | Status: DC

## 2013-01-07 MED ORDER — HYDROCODONE-ACETAMINOPHEN 5-325 MG PO TABS: 1.0000 | ORAL_TABLET | ORAL | Status: DC | PRN

## 2013-01-07 NOTE — Progress Notes (Signed)
Pt educated and informed of DC information. Verbalizes understanding of f/u appts and medications. IV and tele box removed. Pt ready for DC, states he will drive home and feels fine to do so.

## 2013-01-07 NOTE — Discharge Summary (Signed)
Physician Discharge Summary  Benjamin Warner QVZ:[id number] DOB: 1981/05/20 DOA: 01/03/2013  PCP: Benjamin Boga, MD  Admit date: 01/03/2013 Discharge date: 01/07/2013  Time spent: >30 minutes  Recommendations for Outpatient Follow-up:  Discharge Orders   Future Appointments Provider Department Dept Phone   01/13/2013 2:45 PM Benjamin Savers, MD [facility] [place] [phone]   Future Orders Complete By Expires                                 Discharge Diagnoses:  Active Problems:   LIVER FUNCTION TESTS, ABNORMAL   HTN (hypertension)   Rhabdomyolysis   Discharge Condition: Improved/stable  Diet recommendation: 2g sodium  Filed Weights   01/03/13 0651  Weight: 124.6 kg (274 lb 11.1 oz)    History of present illness:  Benjamin Warner is a 32 y.o. male  has a past medical history of Rhabdomyolysis (8/03); Hypertension (approx 2005); Elevated CK; and Snoring.  Presented with  24 hours af muscle ache, fatigue and dark urine. He works out on a regular bases but have had some more exercises with his arms than usual although nothing out of the ordinary. IN the past he have had rhabdomyalisis and had a muscle biopsy that was normal. He presented to ER and was found to have CK >50000. With preserved cr.  Patient feels he has not been drinking as much fluids as usual.  Hospitalist was calleld for admission.    Hospital Course:  Rhabdomyolysis  -As discussed above, upon admission he was found to have a Severely elevated CK, no evidence of renal insufficiency.  -He was aggressively hydrated with appropriate IV fluids, and renal was consulted and assisted with management. -Ck gradually improved and his symptoms resolved and his renal function has remained within normal limits. His been instructed to continue to drink increased fluids upon discharge and is to followup with his PCP. Marland Kitchen LIVER FUNCTION TESTS, ABNORMAL/Transaminitis -his LFTs trended down during  this hospital stay the acute hepatitis panel neg and [place] with hepatic steatosis, his alcohol use likely the top contributing factor to his transaminitis.  -he admitted to drinking 6-12beers about 3days per wk, denies h/o DTs  -counseled to quit alcohol. Marland Kitchen HTN (hypertension) - holding Maxide, clonidine prn  .Alcohol use  -counseled to quit  -coninue mvi, thiamine and folate -He did not have any signs of withdrawal during his hospital stay   Procedures:  none  Consultations: renal Discharge Exam: Filed Vitals:   01/06/13 0350 01/06/13 1346 01/06/13 1924 01/07/13 0420  BP: 136/83 144/75 158/97 130/81  Pulse: 76 76 84 62  Temp: 98.2 F (36.8 C) 98.1 F (36.7 C) 98.8 F (37.1 C) 98.1 F (36.7 C)  TempSrc: Oral Oral Oral Oral  Resp: 20 21 18 20   Height:      Weight:      SpO2: 98% 100% 98% 98%   Exam:  General: Alert and oriented x3, in NAD  Cardiovascular: RRR  Respiratory: CTAB  Abdomen: soft +BS NT/ND  Extremities: No cyanosis and no edema, no tremors   Discharge Instructions  Discharge Orders   Future Appointments Provider Department Dept Phone   01/13/2013 2:45 PM Benjamin Savers, MD  [facility] [place] [phone]   Future Orders Complete By Expires     Diet - low sodium heart healthy  As directed     Discharge instructions  As directed     Comments:  Drink increased fluids for hydration. Avoid exercise, weight lifting in the next week.    Increase activity slowly  As directed         Medication List    STOP taking these medications       PROBIOTIC DAILY PO      TAKE these medications       folic acid 1 MG tablet  Commonly known as:  FOLVITE  Take 1 tablet (1 mg total) by mouth daily.     HYDROcodone-acetaminophen 5-325 MG per tablet  Commonly known as:  NORCO/VICODIN  Take 1-2 tablets by mouth every 4 (four) hours as needed.     pantoprazole 40 MG tablet  Commonly known as:  PROTONIX  Take 1 tablet (40 mg total) by mouth  daily.     triamterene-hydrochlorothiazide 37.5-25 MG per tablet  Commonly known as:  MAXZIDE-25  Take 0.5 tablets by mouth daily.       multivitamin with mineral with tabs Take 1 tablet by mouth daily Folic acid 1 mg tablet Take 1 tablet by mouth daily Thiamine 100 mg tablet Take 1 tablet by mouth daily No Known Allergies     Follow-up Information   Follow up with Benjamin Boga, MD. (in 1-2weeks, call  for appt upon discharge)    Contact information:   [address] Christena Flake [facility] [place] [address] [phone]        The results of significant diagnostics from this hospitalization (including imaging, microbiology, ancillary and laboratory) are listed below for reference.    Significant Diagnostic Studies: US Abdomen Complete  01/05/2013   *RADIOLOGY REPORT*  Clinical Data:  Elevated LFTs.  COMPLETE ABDOMINAL ULTRASOUND  Comparison:  None.  Findings:  Gallbladder:  No gallstones, gallbladder wall thickening, or pericholecystic fluid.  Common bile duct:  Measures 0.6 cm.  Liver:  The liver parenchyma is slightly echogenic.  No focal liver abnormality.  Main portal vein is patent.  IVC:  Not well identified.  Pancreas:  Not visualized.  Spleen:  Poorly visualized.  Measures 6.1 cm in length.  Right Kidney:  Right kidney measures 11.6 cm in length.  Negative for hydronephrosis.  Lower pole is not well visualized due to bowel gas.  Left Kidney:  Left kidney measures 12.4 cm in length without hydronephrosis.  Abdominal aorta:  No aneurysm identified.  IMPRESSION: No acute abnormalities.  The liver parenchyma is echogenic.  Findings could be associated with hepatic steatosis.  Bowel gas limits evaluation of certain areas as described.   Original Report Authenticated By: Benjamin Warner, M.D.    Microbiology: Recent Results (from the past 240 hour(s))  URINE CULTURE     Status: None   Collection Time    01/03/13  8:44 AM      Result Value Range Status   Specimen Description URINE,  RANDOM   Final   Special Requests ADDED [id number]   Final   Culture  Setup Time 01/03/2013 19:01   Final   Colony Count NO GROWTH   Final   Culture NO GROWTH   Final   Report Status 01/04/2013 FINAL   Final     Labs: Basic Metabolic Panel:  Recent Labs Lab 01/03/13 0001 01/03/13 0750  01/03/13 2236 01/04/13 0410 01/05/13 0428 01/06/13 0445 01/07/13 0510  NA 136 139  < > 138 137 139 139 138  K 4.1 3.9  < > 3.9 4.2 3.6 4.2 4.0  CL 97 106  < > 103 103 98 102 102  CO2 27  26  < > 26 27 33* 28 28  GLUCOSE 119* 100*  < > 90 86 96 82 90  BUN 14 11  < > 13 11 13 13 11   CREATININE 1.20 1.13  < > 1.08 1.11 1.12 1.15 1.13  CALCIUM 9.6 8.3*  < > 8.9 9.0 9.4 9.1 9.0  MG  --  1.9  --   --   --   --   --   --   PHOS  --  2.6  --   --   --   --   --   --   < > = values in this interval not displayed. Liver Function Tests:  Recent Labs Lab 01/03/13 0001 01/03/13 0750 01/04/13 0410 01/05/13 0428 01/06/13 0445  AST 1037* 905* 974* 762* 464*  ALT 231* 229* 270* 271* 248*  ALKPHOS 63 53 49 51 52  BILITOT 0.5 0.4 0.6 0.4 0.2*  PROT 7.2 6.2 6.3 6.5 6.6  ALBUMIN 4.0 3.3* 3.3* 3.4* 3.4*   No results found for this basename: LIPASE, AMYLASE,  in the last 168 hours No results found for this basename: AMMONIA,  in the last 168 hours CBC:  Recent Labs Lab 01/03/13 0001 01/04/13 0410  WBC 12.0* 9.4  NEUTROABS 7.6  --   HGB 15.5 13.6  HCT 43.7 40.2  MCV 81.7 83.1  PLT 227 207   Cardiac Enzymes:  Recent Labs Lab 01/03/13 0001  01/04/13 0410 01/05/13 0428 01/06/13 0445 01/06/13 1348 01/07/13 0510  CKTOTAL >50000*  < > >50000* 49791* 19455* 15327* 7653*  CKMB 132.4*  --   --   --   --   --   --   < > = values in this interval not displayed. BNP: BNP (last 3 results) No results found for this basename: PROBNP,  in the last 8760 hours CBG: No results found for this basename: GLUCAP,  in the last 168 hours     Signed:  Yazmina Pareja C  [facility] 01/07/2013, 10:25 AM

## 2013-01-10 DIAGNOSIS — Z0279 Encounter for issue of other medical certificate: Secondary | ICD-10-CM

## 2013-01-13 ENCOUNTER — Encounter: Payer: Self-pay | Admitting: Internal Medicine

## 2013-01-13 ENCOUNTER — Ambulatory Visit (INDEPENDENT_AMBULATORY_CARE_PROVIDER_SITE_OTHER): Payer: BC Managed Care – PPO | Admitting: Internal Medicine

## 2013-01-13 VITALS — BP 120/74 | HR 84 | Temp 98.2°F | Resp 20 | Wt 273.0 lb

## 2013-01-13 DIAGNOSIS — I10 Essential (primary) hypertension: Secondary | ICD-10-CM

## 2013-01-13 DIAGNOSIS — R748 Abnormal levels of other serum enzymes: Secondary | ICD-10-CM

## 2013-01-13 DIAGNOSIS — M6282 Rhabdomyolysis: Secondary | ICD-10-CM

## 2013-01-13 DIAGNOSIS — G737 Myopathy in diseases classified elsewhere: Secondary | ICD-10-CM

## 2013-01-13 DIAGNOSIS — E889 Metabolic disorder, unspecified: Secondary | ICD-10-CM | POA: Insufficient documentation

## 2013-01-13 DIAGNOSIS — R945 Abnormal results of liver function studies: Secondary | ICD-10-CM

## 2013-01-13 LAB — COMPREHENSIVE METABOLIC PANEL
ALT: 73 U/L — ABNORMAL HIGH (ref 0–53)
Albumin: 4.2 g/dL (ref 3.5–5.2)
CO2: 28 mEq/L (ref 19–32)
Calcium: 9.2 mg/dL (ref 8.4–10.5)
Chloride: 103 mEq/L (ref 96–112)
GFR: 86.64 mL/min (ref >60.00)
Glucose, Bld: 95 mg/dL (ref 70–99)
Sodium: 138 mEq/L (ref 135–145)
Total Bilirubin: 0.4 mg/dL (ref 0.3–1.2)
Total Protein: 7.3 g/dL (ref 6.0–8.3)

## 2013-01-13 LAB — CK: Total CK: 1103 U/L — ABNORMAL HIGH (ref 7–232)

## 2013-01-13 NOTE — Patient Instructions (Addendum)
Limit your sodium (Salt) intake  Please check your blood pressure on a regular basis.  If it is consistently greater than 150/90, please make an office appointment.  Discontinue diuretic therapy  Rheumatology followup as discussedDASH Diet The DASH diet stands for "Dietary Approaches to Stop Hypertension." It is a healthy eating plan that has been shown to reduce high blood pressure (hypertension) in as little as 14 days, while also possibly providing other significant health benefits. These other health benefits include reducing the risk of breast cancer after menopause and reducing the risk of type 2 diabetes, heart disease, colon cancer, and stroke. Health benefits also include weight loss and slowing kidney failure in patients with chronic kidney disease.  DIET GUIDELINES  Limit salt (sodium). Your diet should contain less than 1500 mg of sodium daily.  Limit refined or processed carbohydrates. Your diet should include mostly whole grains. Desserts and added sugars should be used sparingly.  Include small amounts of heart-healthy fats. These types of fats include nuts, oils, and tub margarine. Limit saturated and trans fats. These fats have been shown to be harmful in the body. CHOOSING FOODS  The following food groups are based on a 2000 calorie diet. See your Registered Dietitian for individual calorie needs. Grains and Grain Products (6 to 8 servings daily)  Eat More Often: Whole-wheat bread, brown rice, whole-grain or wheat pasta, quinoa, popcorn without added fat or salt (air popped).  Eat Less Often: White bread, white pasta, white rice, cornbread. Vegetables (4 to 5 servings daily)  Eat More Often: Fresh, frozen, and canned vegetables. Vegetables may be raw, steamed, roasted, or grilled with a minimal amount of fat.  Eat Less Often/Avoid: Creamed or fried vegetables. Vegetables in a cheese sauce. Fruit (4 to 5 servings daily)  Eat More Often: All fresh, canned (in natural  juice), or frozen fruits. Dried fruits without added sugar. One hundred percent fruit juice ( cup [237 mL] daily).  Eat Less Often: Dried fruits with added sugar. Canned fruit in light or heavy syrup. [employer], Fish, and Poultry (2 servings or less daily. One serving is 3 to 4 oz [85-114 g]).  Eat More Often: Ninety percent or leaner ground beef, tenderloin, sirloin. Round cuts of beef, chicken breast, [place] breast. All fish. Grill, bake, or broil your meat. Nothing should be fried.  Eat Less Often/Avoid: Fatty cuts of meat, [place], or chicken leg, thigh, or wing. Fried cuts of meat or fish. Dairy (2 to 3 servings)  Eat More Often: Low-fat or fat-free milk, low-fat plain or light yogurt, reduced-fat or part-skim cheese.  Eat Less Often/Avoid: Milk (whole, 2%).Whole milk yogurt. Full-fat cheeses. Nuts, Seeds, and Legumes (4 to 5 servings per week)  Eat More Often: All without added salt.  Eat Less Often/Avoid: Salted nuts and seeds, canned beans with added salt. Fats and Sweets (limited)  Eat More Often: Vegetable oils, tub margarines without trans fats, sugar-free gelatin. Mayonnaise and salad dressings.  Eat Less Often/Avoid: Coconut oils, palm oils, butter, stick margarine, cream, half and half, cookies, candy, pie. FOR MORE INFORMATION The Dash Diet Eating Plan: www.dashdiet.org Document Released: 06/29/2011 Document Revised: 10/02/2011 Document Reviewed: 06/29/2011 [facility] Patient Information 2014 [place], [place].

## 2013-01-13 NOTE — Progress Notes (Signed)
Subjective:    Patient ID: Benjamin Warner, male    DOB: 1981/04/29, 32 y.o.   MRN: [id number]  HPI  32 year old patient who has a history of chronically elevated CPKs who is seen today following a hospital discharge for recurrent exercise induced rhabdomyolysis. He presented with muscle weakness pain dark urine and CPK was greater than 50,000. He was dictated with aggressive fluid resuscitation and a renal consult was obtained. Acute renal failure was avoided. At the present time he feels well He feels that his lack of ability to participate in vigorous exercise is interfering with his job and his ability to lose weight. 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  BP Readings from Last 3 Encounters:  01/13/13 120/74  01/07/13 130/81  12/30/12 120/80    Past Medical History  Diagnosis Date  . Rhabdomyolysis 8/03    ck=20,000-unclear etiology  . Hypertension approx 2005  . Elevated CK     chronic, ~ 500, saw neuro, muscle Bx 12-2010: negative   . Snoring     sleep study 03-2010----> negative for sleep apnea, Rx wt loss    . Rhabdomyolysis   . GERD (gastroesophageal reflux disease)     History   Social History  . Marital Status: Married    Spouse Name: N/A    Number of Children: 1  . Years of Education: N/A   Occupational History  .     Social History Main Topics  . Smoking status: Former Games developer  . Smokeless tobacco: Never Used  . Alcohol Use: Yes     Comment: WEEKENDS  . Drug Use: Yes    Special: Ketamine, MDMA (Ecstacy)     Comment: 10 years ago  . Sexually Active: Not on file   Other Topics Concern  . Not on file   Social History Narrative   Tobacco -- smoked "socially" as a teenage x 2-3 years.  Nonsmoker at present   Diet -- avoiding fast food but still eats high in fat   Exercise -- very active at work    Past Surgical History  Procedure Laterality Date  . Wisdom tooth extraction    . Muscle biopsy    . Cyst excision     2012---Excision of right upper back and left lower back epidermoid      Family History  Problem Relation Age of Onset  . Hypertension Mother   . Heart attack Father 63  . Hypertension Father   . Hyperlipidemia Neg Hx   . Diabetes Neg Hx   . Sudden death Neg Hx     No Known Allergies  Current Outpatient Prescriptions on File Prior to Visit  Medication Sig Dispense Refill  . folic acid (FOLVITE) 1 MG tablet Take 1 tablet (1 mg total) by mouth daily.  30 tablet  0  . HYDROcodone-acetaminophen (NORCO/VICODIN) 5-325 MG per tablet Take 1-2 tablets by mouth every 4 (four) hours as needed.  20 tablet  0  . Multiple Vitamin (MULTIVITAMIN WITH MINERALS) TABS Take 1 tablet by mouth daily.  30 tablet  0  . pantoprazole (PROTONIX) 40 MG tablet Take 1 tablet (40 mg total) by mouth daily.  90 tablet  3  . thiamine 100 MG tablet Take 1 tablet (100 mg total) by mouth daily.  30 tablet  0   No current facility-administered medications on file prior to visit.    BP 120/74  Pulse 84  Temp(Src) 98.2 F (36.8 C) (Oral)  Resp 20  Wt 273 lb (123.832 kg)  BMI 39.17 kg/m2  SpO2 98%     Review of Systems  Constitutional: Negative for fever, chills, appetite change and fatigue.  HENT: Negative for hearing loss, ear pain, congestion, sore throat, trouble swallowing, neck stiffness, dental problem, voice change and tinnitus.   Eyes: Negative for pain, discharge and visual disturbance.  Respiratory: Negative for cough, chest tightness, wheezing and stridor.   Cardiovascular: Negative for chest pain, palpitations and leg swelling.  Gastrointestinal: Negative for nausea, vomiting, abdominal pain, diarrhea, constipation, blood in stool and abdominal distention.  Genitourinary: Negative for urgency, hematuria, flank pain, discharge, difficulty urinating and genital sores.  Musculoskeletal: Positive for myalgias. Negative for back pain, joint swelling, arthralgias and gait problem.  Skin: Negative for  rash.  Neurological: Negative for dizziness, syncope, speech difficulty, weakness, numbness and headaches.  Hematological: Negative for adenopathy. Does not bruise/bleed easily.  Psychiatric/Behavioral: Negative for behavioral problems and dysphoric mood. The patient is not nervous/anxious.        Objective:   Physical Exam  Constitutional: He is oriented to person, place, and time. He appears well-developed.  Overweight Weight 273 Blood pressure 120/74  HENT:  Head: Normocephalic.  Right Ear: External ear normal.  Left Ear: External ear normal.  Eyes: Conjunctivae and EOM are normal.  Neck: Normal range of motion.  Cardiovascular: Normal rate and normal heart sounds.   Pulmonary/Chest: Breath sounds normal.  Abdominal: Bowel sounds are normal.  Musculoskeletal: Normal range of motion. He exhibits no edema and no tenderness.  Neurological: He is alert and oriented to person, place, and time.  Psychiatric: He has a normal mood and affect. His behavior is normal.          Assessment & Plan:   History of recurrent exercise induced rhabdomyolysis. We'll discontinue diuretic therapy. Importance of good hydration discussed. Will set up for oncology evaluation Hypertension. We'll discontinue diuretic therapy. Home blood pressure monitoring encouraged weight loss low salt diet all recommended. If blood pressures consistently greater than 140 over 90, will place on alternative blood pressure medication History of elevated LFTs. We'll repeat

## 2013-05-29 ENCOUNTER — Other Ambulatory Visit: Payer: Self-pay

## 2014-01-01 ENCOUNTER — Ambulatory Visit (INDEPENDENT_AMBULATORY_CARE_PROVIDER_SITE_OTHER): Payer: 59 | Admitting: Internal Medicine

## 2014-01-01 ENCOUNTER — Encounter: Payer: Self-pay | Admitting: Internal Medicine

## 2014-01-01 VITALS — BP 116/80 | HR 83 | Temp 98.5°F | Resp 20 | Ht 69.75 in | Wt 262.0 lb

## 2014-01-01 DIAGNOSIS — G7289 Other specified myopathies: Secondary | ICD-10-CM

## 2014-01-01 DIAGNOSIS — E889 Metabolic disorder, unspecified: Secondary | ICD-10-CM

## 2014-01-01 DIAGNOSIS — K219 Gastro-esophageal reflux disease without esophagitis: Secondary | ICD-10-CM

## 2014-01-01 DIAGNOSIS — R945 Abnormal results of liver function studies: Secondary | ICD-10-CM

## 2014-01-01 DIAGNOSIS — Z0000 Encounter for general adult medical examination without abnormal findings: Secondary | ICD-10-CM

## 2014-01-01 DIAGNOSIS — R748 Abnormal levels of other serum enzymes: Secondary | ICD-10-CM

## 2014-01-01 DIAGNOSIS — G737 Myopathy in diseases classified elsewhere: Principal | ICD-10-CM

## 2014-01-01 DIAGNOSIS — I10 Essential (primary) hypertension: Secondary | ICD-10-CM

## 2014-01-01 LAB — CBC WITH DIFFERENTIAL/PLATELET
BASOS PCT: 0.3 % (ref 0.0–3.0)
Basophils Absolute: 0 10*3/uL (ref 0.0–0.1)
EOS ABS: 0.1 10*3/uL (ref 0.0–0.7)
Eosinophils Relative: 0.9 % (ref 0.0–5.0)
HCT: 45.8 % (ref 39.0–52.0)
Hemoglobin: 15 g/dL (ref 13.0–17.0)
LYMPHS PCT: 36.2 % (ref 12.0–46.0)
Lymphs Abs: 3.1 10*3/uL (ref 0.7–4.0)
MCHC: 32.8 g/dL (ref 30.0–36.0)
MCV: 83.9 fl (ref 78.0–100.0)
MONO ABS: 0.6 10*3/uL (ref 0.1–1.0)
Monocytes Relative: 7.4 % (ref 3.0–12.0)
Neutro Abs: 4.7 10*3/uL (ref 1.4–7.7)
Neutrophils Relative %: 55.2 % (ref 43.0–77.0)
PLATELETS: 238 10*3/uL (ref 150.0–400.0)
RBC: 5.46 Mil/uL (ref 4.22–5.81)
RDW: 15.7 % — ABNORMAL HIGH (ref 11.5–15.5)
WBC: 8.5 10*3/uL (ref 4.0–10.5)

## 2014-01-01 LAB — COMPREHENSIVE METABOLIC PANEL
ALT: 55 U/L — ABNORMAL HIGH (ref 0–53)
AST: 96 U/L — ABNORMAL HIGH (ref 0–37)
Albumin: 4.4 g/dL (ref 3.5–5.2)
Alkaline Phosphatase: 50 U/L (ref 39–117)
BUN: 17 mg/dL (ref 6–23)
CALCIUM: 9.7 mg/dL (ref 8.4–10.5)
CHLORIDE: 102 meq/L (ref 96–112)
CO2: 29 meq/L (ref 19–32)
CREATININE: 1.1 mg/dL (ref 0.4–1.5)
GFR: 94.9 mL/min (ref >60.00)
Glucose, Bld: 80 mg/dL (ref 70–99)
Potassium: 4.1 mEq/L (ref 3.5–5.1)
Sodium: 138 mEq/L (ref 135–145)
Total Bilirubin: 0.7 mg/dL (ref 0.2–1.2)
Total Protein: 7.6 g/dL (ref 6.0–8.3)

## 2014-01-01 LAB — LIPID PANEL
CHOLESTEROL: 178 mg/dL (ref 0–200)
HDL: 45.4 mg/dL (ref >39.00)
LDL Cholesterol: 100 mg/dL — ABNORMAL HIGH (ref 0–99)
NonHDL: 132.6
TRIGLYCERIDES: 161 mg/dL — AB (ref 0.0–149.0)
Total CHOL/HDL Ratio: 4
VLDL: 32.2 mg/dL (ref 0.0–40.0)

## 2014-01-01 LAB — TSH: TSH: 0.7 u[IU]/mL (ref 0.35–4.50)

## 2014-01-01 LAB — CK: CK TOTAL: 3974 U/L — AB (ref 7–232)

## 2014-01-01 NOTE — Progress Notes (Signed)
Subjective:    Patient ID: Benjamin Warner, male    DOB: [birth year], 33 y.o.   MRN: [id number]  HPI 33 year-old patient who is seen today for a preventive health examination.  Medical problems include exogenous obesity hypertension and chronically elevated CPK. He is status post muscle biopsy in 2012 that was normal. He has had a history of exercise associated rhabdomyolysis. He has been evaluated for OSA in the past He was hospitalized in 2014 for acute rhabdomyolysis, complicated by elevated liver function abdomen allergies.  Acute hepatitis screen was normal. Presently doing quite well.  Exercises 4-6 times per week at his local health club.  Social history born in [place]. Nonsmoker. Married 2 young daughters. Works as a IT sales professional from Last 3 Encounters:  01/01/14 262 lb (118.842 kg)  01/13/13 273 lb (123.832 kg)  01/03/13 274 lb 11.1 oz (124.6 kg)    Past Medical History  Diagnosis Date  . Rhabdomyolysis 8/03    ck=20,000-unclear etiology  . Hypertension approx 2005  . Elevated CK     chronic, ~ 500, saw neuro, muscle Bx 12-2010: negative   . Snoring     sleep study 03-2010----> negative for sleep apnea, Rx wt loss    . Rhabdomyolysis   . GERD (gastroesophageal reflux disease)     History   Social History  . Marital Status: Married    Spouse Name: N/A    Number of Children: 1  . Years of Education: N/A   Occupational History  .     Social History Main Topics  . Smoking status: Former Games developer  . Smokeless tobacco: Never Used  . Alcohol Use: Yes     Comment: WEEKENDS  . Drug Use: Yes    Special: Ketamine, MDMA (Ecstacy)     Comment: 10 years ago  . Sexual Activity: Not on file   Other Topics Concern  . Not on file   Social History Narrative   Tobacco -- smoked "socially" as a teenage x 2-3 years.  Nonsmoker at present   Diet -- avoiding fast food but still eats high in fat   Exercise -- very active at work    Past Surgical History   Procedure Laterality Date  . Wisdom tooth extraction    . Muscle biopsy    . Cyst excision      2012---Excision of right upper back and left lower back epidermoid      Family History  Problem Relation Age of Onset  . Hypertension Mother   . Heart attack Father 24  . Hypertension Father   . Hyperlipidemia Neg Hx   . Diabetes Neg Hx   . Sudden death Neg Hx     No Known Allergies  No current outpatient prescriptions on file prior to visit.   No current facility-administered medications on file prior to visit.    BP 116/80  Pulse 83  Temp(Src) 98.5 F (36.9 C) (Oral)  Resp 20  Ht 5' 9.75" (1.772 m)  Wt 262 lb (118.842 kg)  BMI 37.85 kg/m2  SpO2 97%       Review of Systems  Constitutional: Negative for fever, chills, appetite change and fatigue.  HENT: Negative for congestion, dental problem, ear pain, hearing loss, sore throat, tinnitus, trouble swallowing and voice change.   Eyes: Negative for pain, discharge and visual disturbance.  Respiratory: Negative for cough, chest tightness, wheezing and stridor.   Cardiovascular: Negative for chest pain, palpitations and leg swelling.  Gastrointestinal: Negative  for nausea, vomiting, abdominal pain, diarrhea, constipation, blood in stool and abdominal distention.  Genitourinary: Negative for urgency, hematuria, flank pain, discharge, difficulty urinating and genital sores.  Musculoskeletal: Negative for arthralgias, back pain, gait problem, joint swelling, myalgias and neck stiffness.  Skin: Negative for rash.  Neurological: Negative for dizziness, syncope, speech difficulty, weakness, numbness and headaches.  Hematological: Negative for adenopathy. Does not bruise/bleed easily.  Psychiatric/Behavioral: Negative for behavioral problems and dysphoric mood. The patient is not nervous/anxious.        Objective:   Physical Exam  Constitutional: He appears well-developed and well-nourished.  HENT:  Head: Normocephalic and  atraumatic.  Right Ear: External ear normal.  Left Ear: External ear normal.  Nose: Nose normal.  Mouth/Throat: Oropharynx is clear and moist.  Eyes: Conjunctivae and EOM are normal. Pupils are equal, round, and reactive to light. No scleral icterus.  Neck: Normal range of motion. Neck supple. No JVD present. No thyromegaly present.  Cardiovascular: Regular rhythm, normal heart sounds and intact distal pulses.  Exam reveals no gallop and no friction rub.   No murmur heard. Pulmonary/Chest: Effort normal and breath sounds normal. He exhibits no tenderness.  Abdominal: Soft. Bowel sounds are normal. He exhibits no distension and no mass. There is no tenderness.  Genitourinary: Penis normal.  Musculoskeletal: Normal range of motion. He exhibits no edema and no tenderness.  Lymphadenopathy:    He has no cervical adenopathy.  Neurological: He is alert. He has normal reflexes. No cranial nerve deficit. Coordination normal.  Skin: Skin is warm and dry. No rash noted.  Psychiatric: He has a normal mood and affect. His behavior is normal.          Assessment & Plan:   Preventive health exam Chronically elevated CPK with history of rhabdomyolysis Exogenous obesity H/o Hypertension well controlled off medications   Home blood pressure monitoring encouraged Low-salt diet recommended Regular exercise encouraged  Recheck 1 year

## 2014-01-01 NOTE — Progress Notes (Signed)
Pre-visit discussion using our clinic review tool. No additional management support is needed unless otherwise documented below in the visit note.  

## 2014-01-01 NOTE — Patient Instructions (Signed)

## 2014-01-02 ENCOUNTER — Telehealth: Payer: Self-pay | Admitting: Internal Medicine

## 2014-01-02 NOTE — Telephone Encounter (Signed)
Relevant patient education mailed to patient.  

## 2014-10-03 ENCOUNTER — Encounter (HOSPITAL_BASED_OUTPATIENT_CLINIC_OR_DEPARTMENT_OTHER): Payer: Self-pay

## 2014-10-03 ENCOUNTER — Emergency Department (HOSPITAL_BASED_OUTPATIENT_CLINIC_OR_DEPARTMENT_OTHER)
Admission: EM | Admit: 2014-10-03 | Discharge: 2014-10-03 | Disposition: A | Discharge status: Home / Self Care | Payer: BLUE CROSS/BLUE SHIELD | Attending: Emergency Medicine | Admitting: Emergency Medicine

## 2014-10-03 DIAGNOSIS — Y9389 Activity, other specified: Secondary | ICD-10-CM | POA: Insufficient documentation

## 2014-10-03 DIAGNOSIS — I10 Essential (primary) hypertension: Secondary | ICD-10-CM | POA: Insufficient documentation

## 2014-10-03 DIAGNOSIS — W57XXXA Bitten or stung by nonvenomous insect and other nonvenomous arthropods, initial encounter: Secondary | ICD-10-CM | POA: Insufficient documentation

## 2014-10-03 DIAGNOSIS — Y998 Other external cause status: Secondary | ICD-10-CM | POA: Diagnosis not present

## 2014-10-03 DIAGNOSIS — K219 Gastro-esophageal reflux disease without esophagitis: Secondary | ICD-10-CM | POA: Insufficient documentation

## 2014-10-03 DIAGNOSIS — Y9289 Other specified places as the place of occurrence of the external cause: Secondary | ICD-10-CM | POA: Diagnosis not present

## 2014-10-03 DIAGNOSIS — S80861A Insect bite (nonvenomous), right lower leg, initial encounter: Secondary | ICD-10-CM

## 2014-10-03 DIAGNOSIS — Z87891 Personal history of nicotine dependence: Secondary | ICD-10-CM | POA: Diagnosis not present

## 2014-10-03 DIAGNOSIS — Z8739 Personal history of other diseases of the musculoskeletal system and connective tissue: Secondary | ICD-10-CM | POA: Diagnosis not present

## 2014-10-03 MED ORDER — DOXYCYCLINE HYCLATE 100 MG PO TABS
100.0000 mg | ORAL_TABLET | Freq: Once | ORAL | Status: AC
  Administered 2014-10-03: 100 mg via ORAL
  Filled 2014-10-03: qty 1

## 2014-10-03 MED ORDER — HYDROCHLOROTHIAZIDE 25 MG PO TABS: 25.0000 mg | ORAL_TABLET | Freq: Every day | ORAL | Status: DC

## 2014-10-03 MED ORDER — DOXYCYCLINE HYCLATE 100 MG PO CAPS: 100.0000 mg | ORAL_CAPSULE | Freq: Two times a day (BID) | ORAL | Status: DC

## 2014-10-03 NOTE — Discharge Instructions (Signed)
Tick Bite Information Ticks are insects that attach themselves to the skin and draw blood for food. There are various types of ticks. Common types include wood ticks and deer ticks. Most ticks live in shrubs and grassy areas. Ticks can climb onto your body when you make contact with leaves or grass where the tick is waiting. The most common places on the body for ticks to attach themselves are the scalp, neck, armpits, waist, and groin. Most tick bites are harmless, but sometimes ticks carry germs that cause diseases. These germs can be spread to a person during the tick's feeding process. The chance of a disease spreading through a tick bite depends on:   The type of tick.  Time of year.   How long the tick is attached.   Geographic location.  HOW CAN YOU PREVENT TICK BITES? Take these steps to help prevent tick bites when you are outdoors:  Wear protective clothing. Long sleeves and long pants are best.   Wear [place] clothes so you can see ticks more easily.  Tuck your pant legs into your socks.   If walking on a trail, stay in the middle of the trail to avoid brushing against bushes.  Avoid walking through areas with long grass.  Put insect repellent on all exposed skin and along boot tops, pant legs, and sleeve cuffs.   Check clothing, hair, and skin repeatedly and before going inside.   Brush off any ticks that are not attached.  Take a shower or bath as soon as possible after being outdoors.  WHAT IS THE PROPER WAY TO REMOVE A TICK? Ticks should be removed as soon as possible to help prevent diseases caused by tick bites.  If latex gloves are available, put them on before trying to remove a tick.   Using fine-point tweezers, grasp the tick as close to the skin as possible. You may also use curved forceps or a tick removal tool. Grasp the tick as close to its head as possible. Avoid grasping the tick on its body.  Pull gently with steady upward pressure until the  tick lets go. Do not twist the tick or jerk it suddenly. This may break off the tick's head or mouth parts.  Do not squeeze or crush the tick's body. This could force disease-carrying fluids from the tick into your body.   After the tick is removed, wash the bite area and your hands with soap and water or other disinfectant such as alcohol.  Apply a small amount of antiseptic cream or ointment to the bite site.   Wash and disinfect any instruments that were used.  Do not try to remove a tick by applying a hot match, petroleum jelly, or fingernail polish to the tick. These methods do not work and may increase the chances of disease being spread from the tick bite.  WHEN SHOULD YOU SEEK MEDICAL CARE? Contact your health care provider if you are unable to remove a tick from your skin or if a part of the tick breaks off and is stuck in the skin.  After a tick bite, you need to be aware of signs and symptoms that could be related to diseases spread by ticks. Contact your health care provider if you develop any of the following in the days or weeks after the tick bite:  Unexplained fever.  Rash. A circular rash that appears days or weeks after the tick bite may indicate the possibility of Lyme disease. The rash may resemble  a target with a bull's-eye and may occur at a different part of your body than the tick bite.  Redness and swelling in the area of the tick bite.   Tender, swollen lymph glands.   Diarrhea.   Weight loss.   Cough.   Fatigue.   Muscle, joint, or bone pain.   Abdominal pain.   Headache.   Lethargy or a change in your level of consciousness.  Difficulty walking or moving your legs.   Numbness in the legs.   Paralysis.  Shortness of breath.   Confusion.   Repeated vomiting.  Document Released: 07/07/2000 Document Revised: 04/30/2013 Document Reviewed: 12/18/2012 [facility] Patient Information 2015 [place], [place]. This information is not  intended to replace advice given to you by your health care provider. Make sure you discuss any questions you have with your health care provider. Hypertension Hypertension, commonly called high blood pressure, is when the force of blood pumping through your arteries is too strong. Your arteries are the blood vessels that carry blood from your heart throughout your body. A blood pressure reading consists of a higher number over a lower number, such as 110/72. The higher number (systolic) is the pressure inside your arteries when your heart pumps. The lower number (diastolic) is the pressure inside your arteries when your heart relaxes. Ideally you want your blood pressure below 120/80. Hypertension forces your heart to work harder to pump blood. Your arteries may become narrow or stiff. Having hypertension puts you at risk for heart disease, stroke, and other problems.  RISK FACTORS Some risk factors for high blood pressure are controllable. Others are not.  Risk factors you cannot control include:   Race. You may be at higher risk if you are African American.  Age. Risk increases with age.  Gender. Men are at higher risk than women before age 34 years. After age 34, women are at higher risk than men. Risk factors you can control include:  Not getting enough exercise or physical activity.  Being overweight.  Getting too much fat, sugar, calories, or salt in your diet.  Drinking too much alcohol. SIGNS AND SYMPTOMS Hypertension does not usually cause signs or symptoms. Extremely high blood pressure (hypertensive crisis) may cause headache, anxiety, shortness of breath, and nosebleed. DIAGNOSIS  To check if you have hypertension, your health care provider will measure your blood pressure while you are seated, with your arm held at the level of your heart. It should be measured at least twice using the same arm. Certain conditions can cause a difference in blood pressure between your right and  left arms. A blood pressure reading that is higher than normal on one occasion does not mean that you need treatment. If one blood pressure reading is high, ask your health care provider about having it checked again. TREATMENT  Treating high blood pressure includes making lifestyle changes and possibly taking medicine. Living a healthy lifestyle can help lower high blood pressure. You may need to change some of your habits. Lifestyle changes may include:  Following the DASH diet. This diet is high in fruits, vegetables, and whole grains. It is low in salt, red meat, and added sugars.  Getting at least 2 hours of brisk physical activity every week.  Losing weight if necessary.  Not smoking.  Limiting alcoholic beverages.  Learning ways to reduce stress. If lifestyle changes are not enough to get your blood pressure under control, your health care provider may prescribe medicine. You may need to take  more than one. Work closely with your health care provider to understand the risks and benefits. HOME CARE INSTRUCTIONS  Have your blood pressure rechecked as directed by your health care provider.   Take medicines only as directed by your health care provider. Follow the directions carefully. Blood pressure medicines must be taken as prescribed. The medicine does not work as well when you skip doses. Skipping doses also puts you at risk for problems.   Do not smoke.   Monitor your blood pressure at home as directed by your health care provider. SEEK MEDICAL CARE IF:   You think you are having a reaction to medicines taken.  You have recurrent headaches or feel dizzy.  You have swelling in your ankles.  You have trouble with your vision. SEEK IMMEDIATE MEDICAL CARE IF:  You develop a severe headache or confusion.  You have unusual weakness, numbness, or feel faint.  You have severe chest or abdominal pain.  You vomit repeatedly.  You have trouble breathing. MAKE SURE  YOU:   Understand these instructions.  Will watch your condition.  Will get help right away if you are not doing well or get worse. Document Released: 07/10/2005 Document Revised: 11/24/2013 Document Reviewed: 05/02/2013 [facility] Patient Information 2015 [place], [place]. This information is not intended to replace advice given to you by your health care provider. Make sure you discuss any questions you have with your health care provider.

## 2014-10-03 NOTE — ED Provider Notes (Signed)
CSN: [id number]     Arrival date & time 10/03/14  1913 History   First MD Initiated Contact with Patient 10/03/14 2040     Chief Complaint  Patient presents with  . Insect Bite     (Consider location/radiation/quality/duration/timing/severity/associated sxs/prior Treatment) HPI The patient noticed that his right knee started to seem sore today. He went to take a shower and took a look at it and he saw a tick on the back side of his knee. He pulled it out and reports that it was pretty small looking. He did not bring into the emergency department for inspection. He suspects he got it sometime within the last 3 days. 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 Aside from noting some discomfort in the knee today and the tick he reports he's been feeling well. He does report however that for about the past 3 evenings he has had a headache at night when he goes to bed. It's generalized in nature and resolves during the day. He does not have any associated symptoms. He has not had fevers, chills, general myalgias or other joint swelling or redness. He has not noticed any rash. He reports his blood pressure is elevated and he was wondering if he could get some blood pressure medication for that. He reports that he used to be on hydrochlorothiazide but lost weight and came off of it but his blood pressure is been going up again as he has gained some weight back. He reports last time he checked his blood pressure was about 160/90. Past Medical History  Diagnosis Date  . Rhabdomyolysis 8/03    ck=20,000-unclear etiology  . Hypertension approx 2005  . Elevated CK     chronic, ~ 500, saw neuro, muscle Bx 12-2010: negative   . Snoring     sleep study 03-2010----> negative for sleep apnea, Rx wt loss    . Rhabdomyolysis   . GERD (gastroesophageal reflux disease)    Past Surgical History  Procedure Laterality Date  . Wisdom tooth extraction    . Muscle  biopsy    . Cyst excision      2012---Excision of right upper back and left lower back epidermoid     Family History  Problem Relation Age of Onset  . Hypertension Mother   . Heart attack Father 106  . Hypertension Father   . Hyperlipidemia Neg Hx   . Diabetes Neg Hx   . Sudden death Neg Hx    History  Substance Use Topics  . Smoking status: Former Games developer  . Smokeless tobacco: Never Used  . Alcohol Use: Yes     Comment: WEEKENDS    Review of Systems  10 Systems reviewed and are negative for acute change except as noted in the HPI.   Allergies  Review of patient's allergies indicates no known allergies.  Home Medications   Prior to Admission medications   Medication Sig Start Date End Date Taking? Authorizing Provider  doxycycline (VIBRAMYCIN) 100 MG capsule Take 1 capsule (100 mg total) by mouth 2 (two) times daily. One po bid x 7 days 10/03/14   Arby Barrette, MD  hydrochlorothiazide (HYDRODIURIL) 25 MG tablet Take 1 tablet (25 mg total) by mouth daily. 10/03/14   Arby Barrette, MD  pantoprazole (PROTONIX) 40 MG tablet Take 40 mg by mouth as needed. 12/30/12   Gordy Savers, MD   BP 178/110 mmHg  Pulse 98  Temp(Src) 98.7 F (37.1 C) (Oral)  Resp 18  Ht 5\' 10"  (1.778 m)  Wt 285 lb (129.275 kg)  BMI 40.89 kg/m2  SpO2 98% Physical Exam  Constitutional: He is oriented to person, place, and time. He appears well-developed and well-nourished.  HENT:  Head: Normocephalic and atraumatic.  Nose: Nose normal.  Mouth/Throat: Oropharynx is clear and moist.  Eyes: EOM are normal. Pupils are equal, round, and reactive to light.  Neck: Neck supple.  Cardiovascular: Normal rate, regular rhythm, normal heart sounds and intact distal pulses.   Pulmonary/Chest: Effort normal and breath sounds normal.  Abdominal: Soft. Bowel sounds are normal. He exhibits no distension. There is no tenderness.  Musculoskeletal: Normal range of motion. He exhibits no edema.  Bilateral lower  extremities are symmetric in appearance. There is no effusion or erythema around the right knee. He has normal range of motion. Close inspection shows a small lesion on the posterior leg just at the top of the calf before being in the actual popliteal fossa. This is around spot of erythema that is 1 cm in diameter with a 1-2 mm pustule in the center of it. There is no associated fluctuance or streaking. The remainder of the leg is normal. There is no groin lymphadenopathy.  Neurological: He is alert and oriented to person, place, and time. He has normal strength. Coordination normal. GCS eye subscore is 4. GCS verbal subscore is 5. GCS motor subscore is 6.  Skin: Skin is warm, dry and intact.  All skin surfaces were inspected for any additional rash and none is present.  Psychiatric: He has a normal mood and affect.    ED Course  Procedures (including critical care time) The area above was first prepped with Betadine solution left to dry in situ. A beveled needle was used to elevate the small pustule. A specimen was collected for culture. This area was then cleaned with alcohol and a Band-Aid applied. Labs Review Labs Reviewed  WOUND CULTURE    Imaging Review No results found.   EKG Interpretation None      MDM   Final diagnoses:  Tick bite of calf, right, initial encounter  Essential hypertension   The patient presents with a tick bite. He has not had any fever or constitutional symptoms. He does however have a localized reaction with a very small pustule. This was cleaned and lanced with a needle. There is no associated abscess in the area is quite small. A culture was obtained and the patient is empirically placed on doxycycline. There have not been associated symptoms of systemic zooanotic infection. The knee itself does not have any erythema or effusion. The bite is actually at the top of the calf and not directly over the knee or the popliteal fossa. There is no streaking present.  The patient also requests refill of antihypertensive medications. At this time there is no indication of hypertensive urgency or emergency. He does however note that his pressures have been elevated and he has not been on medications.    Arby BarretteMarcy Kemi Gell, MD 10/03/14 2127

## 2014-10-03 NOTE — ED Notes (Signed)
Pt reports today was having R knee pain, states he noted a tick behind right knee, removed but wanted to ensure he does not need meds for infection.  States site looked red, denies fever.

## 2014-11-02 ENCOUNTER — Other Ambulatory Visit: Payer: Self-pay

## 2014-11-09 ENCOUNTER — Ambulatory Visit (INDEPENDENT_AMBULATORY_CARE_PROVIDER_SITE_OTHER): Payer: BLUE CROSS/BLUE SHIELD | Admitting: Internal Medicine

## 2014-11-09 ENCOUNTER — Other Ambulatory Visit: Payer: Self-pay

## 2014-11-09 ENCOUNTER — Encounter: Payer: Self-pay | Admitting: Internal Medicine

## 2014-11-09 VITALS — BP 152/108 | HR 99 | Temp 98.0°F | Ht 69.75 in | Wt 285.0 lb

## 2014-11-09 DIAGNOSIS — L918 Other hypertrophic disorders of the skin: Secondary | ICD-10-CM

## 2014-11-09 DIAGNOSIS — I10 Essential (primary) hypertension: Secondary | ICD-10-CM

## 2014-11-09 DIAGNOSIS — M25569 Pain in unspecified knee: Secondary | ICD-10-CM

## 2014-11-09 MED ORDER — TRIAMTERENE-HCTZ 37.5-25 MG PO TABS
0.5000 | ORAL_TABLET | Freq: Every day | ORAL | Status: DC
Start: 2014-11-09 — End: 2015-02-25

## 2014-11-09 MED ORDER — PANTOPRAZOLE SODIUM 40 MG PO TBEC: 40.0000 mg | DELAYED_RELEASE_TABLET | ORAL | Status: DC | PRN

## 2014-11-09 NOTE — Progress Notes (Signed)
Pre visit review using our clinic review tool, if applicable. No additional management support is needed unless otherwise documented below in the visit note. 

## 2014-11-09 NOTE — Progress Notes (Signed)
Subjective:    Patient ID: Benjamin Warner, male    DOB: 09/08/1980, 34 y.o.   MRN: [id number]  DOS:  11/09/2014 Type of visit - description : rov Interval history: Hypertension, since the last time I saw him 2013, he follow-up with Dr. 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 He did so well that his BP was well controlled without any medication. He is start to drive again and for the last 6 months has gradually increase his weight. BP is now elevated. Was prescribed HCTZ temporarily at the ER a month ago, he ran out of HCTZ last week  Continue with knee pain, referral?  Has several skin lesions, somewhat concerned about them   Review of Systems Denies chest pain or difficulty breathing No nausea, vomiting, diarrhea  Past Medical History  Diagnosis Date  . Rhabdomyolysis 8/03    ck=20,000-unclear etiology  . Hypertension approx 2005  . Elevated CK     chronic, ~ 500, saw neuro, muscle Bx 12-2010: negative   . Snoring     sleep study 03-2010----> negative for sleep apnea, Rx wt loss    . Rhabdomyolysis   . GERD (gastroesophageal reflux disease)     Past Surgical History  Procedure Laterality Date  . Wisdom tooth extraction    . Muscle biopsy    . Cyst excision      2012---Excision of right upper back and left lower back epidermoid      History   Social History  . Marital Status: Married    Spouse Name: N/A  . Number of Children: 1  . Years of Education: N/A   Occupational History  . truck driver     Social History Main Topics  . Smoking status: Former Games developermoker  . Smokeless tobacco: Never Used  . Alcohol Use: Yes     Comment: WEEKENDS  . Drug Use: No     Comment: 10 years ago  . Sexual Activity: Not on file   Other Topics Concern  . Not on file   Social History Narrative   Tobacco -- smoked "socially" as a teenage x 2-3 years.  Nonsmoker at present            Medication List       This list is  accurate as of: 11/09/14 11:59 PM.  Always use your most recent med list.               pantoprazole 40 MG tablet  Commonly known as:  PROTONIX  Take 1 tablet (40 mg total) by mouth as needed.     triamterene-hydrochlorothiazide 37.5-25 MG per tablet  Commonly known as:  MAXZIDE-25  Take 0.5 tablets by mouth daily.           Objective:   Physical Exam BP 152/108 mmHg  Pulse 99  Temp(Src) 98 F (36.7 C) (Oral)  Ht 5' 9.75" (1.772 m)  Wt 285 lb (129.275 kg)  BMI 41.17 kg/m2  SpO2 95%  General:   Well developed, well nourished . NAD.  HEENT:  Normocephalic . Face symmetric, atraumatic Lungs:  CTA B Normal respiratory effort, no intercostal retractions, no accessory muscle use. Heart: RRR,  no murmur.  Muscle skeletal: no pretibial edema bilaterally  Skin: Few classic skin tags on the right armpit and Skin: Not pale. Not jaundice Neurologic:  alert & oriented X3.  Speech normal, gait appropriate for age and unassisted Psych--  Cognition and judgment  appear intact.  Cooperative with normal attention span and concentration.  Behavior appropriate. No anxious or depressed appearing.       Assessment & Plan:    Skin tags: Observation, if there is any fast or and cholesterol the uncharacteristic increase in size of: Needs to let me know

## 2014-11-09 NOTE — Patient Instructions (Signed)
Get your blood work before you leave  You also need blood work again in one month (BMP hypertension)  Start taking triamterene-HCTZ: Half tablet every morning If your blood pressure is not well-controlled, go ahead and take 1 tablet every morning  Check the  blood pressure daily Be sure your blood pressure is between 110/65 and  145/85.  if it is consistently higher or lower, let me know     Come back to the office in 3 months  Please schedule an appointment at the front desk    Come back fasting

## 2014-11-10 NOTE — Assessment & Plan Note (Signed)
Hypertension, he was able to lose weight, eat better and exercise, BP was normal without medication however in the last 6 months she has been unable to exercise and is gaining weight. Was rx  HCTZ at the ER a month ago but he ran out last week. Previously, Maxide controlled  his BP well. Plan: BMP Restart Maxide 1/2 po qd , increase to 1 tablet if necessary BMP in one month Keep an eye on BPs, follow-up 3 months, see instructions

## 2014-11-10 NOTE — Assessment & Plan Note (Signed)
knee pain, chronic issue, we referred to sports medicine

## 2014-11-16 ENCOUNTER — Ambulatory Visit: Payer: BLUE CROSS/BLUE SHIELD | Admitting: Family Medicine

## 2014-12-14 ENCOUNTER — Ambulatory Visit (INDEPENDENT_AMBULATORY_CARE_PROVIDER_SITE_OTHER): Payer: BLUE CROSS/BLUE SHIELD | Admitting: Family Medicine

## 2014-12-14 ENCOUNTER — Encounter: Payer: Self-pay | Admitting: Family Medicine

## 2014-12-14 ENCOUNTER — Other Ambulatory Visit (INDEPENDENT_AMBULATORY_CARE_PROVIDER_SITE_OTHER): Payer: BLUE CROSS/BLUE SHIELD

## 2014-12-14 VITALS — BP 133/84 | HR 94 | Ht 70.0 in | Wt 280.0 lb

## 2014-12-14 DIAGNOSIS — I10 Essential (primary) hypertension: Secondary | ICD-10-CM

## 2014-12-14 DIAGNOSIS — M25562 Pain in left knee: Secondary | ICD-10-CM

## 2014-12-14 LAB — BASIC METABOLIC PANEL
BUN: 18 mg/dL (ref 6–23)
CALCIUM: 10.2 mg/dL (ref 8.4–10.5)
CHLORIDE: 99 meq/L (ref 96–112)
CO2: 24 mEq/L (ref 19–32)
Creatinine, Ser: 1.34 mg/dL (ref 0.40–1.50)
GFR: 78.3 mL/min (ref >60.00)
GLUCOSE: 90 mg/dL (ref 70–99)
POTASSIUM: 3.9 meq/L (ref 3.5–5.1)
SODIUM: 136 meq/L (ref 135–145)

## 2014-12-14 NOTE — Patient Instructions (Signed)
Your pain is due to patellofemoral syndrome or a lateral meniscus tear. Both are treated conservatively initially. Icing 15 minutes at a time 3-4 times a day. Ibuprofen 600mg  three times a day OR aleve 2 tabs twice a day with food for pain and inflammation Physical therapy is the most important part of treatment - do home exercises 3 sets of 10 once a day on days you don't go to therapy. Knee brace may help with support but is not proven to prevent your knee from giving out. Dr. Jari SportsmanScholls active series insoles. If not improving over the next 4-6 weeks would consider x-rays and an MRI. Follow up with me 4-6 weeks after starting therapy.

## 2014-12-15 NOTE — Assessment & Plan Note (Signed)
likely due to patellofemoral syndrome again though lateral meniscus tear a possibility.  Start with conservative treatment - icing, nsaids, physical therayp and home exercises.  Better arch support.  F/u in 4-6 weeks.

## 2014-12-15 NOTE — Progress Notes (Signed)
PCP: Willow [facility]Jose [facility]az, MD  Subjective:   HPI: Patient is a 34 y.o. male here for left knee pain.  Patient reports he improved after last visit until past 2-3 months. Knee started buckling, popping, cracking. No swelling but knee feels tight at times. No new injuries or trauma. Pain worse laterally than medial, anterior.  Past Medical History  Diagnosis Date  . Rhabdomyolysis 8/03    ck=20,000-unclear etiology  . Hypertension approx 2005  . Elevated CK     chronic, ~ 500, saw neuro, muscle Bx 12-2010: negative   . Snoring     sleep study 03-2010----> negative for sleep apnea, Rx wt loss    . Rhabdomyolysis   . GERD (gastroesophageal reflux disease)     Current Outpatient Prescriptions on File Prior to Visit  Medication Sig Dispense Refill  . pantoprazole (PROTONIX) 40 MG tablet Take 1 tablet (40 mg total) by mouth as needed. 30 tablet 6  . triamterene-hydrochlorothiazide (MAXZIDE-25) 37.5-25 MG per tablet Take 0.5 tablets by mouth daily. 30 tablet 1   No current facility-administered medications on file prior to visit.    Past Surgical History  Procedure Laterality Date  . Wisdom tooth extraction    . Muscle biopsy    . Cyst excision      2012---Excision of right upper back and left lower back epidermoid      No Known Allergies  History   Social History  . Marital Status: Married    Spouse Name: N/A  . Number of Children: 1  . Years of Education: N/A   Occupational History  . truck driver     Social History Main Topics  . Smoking status: Former Games developermoker  . Smokeless tobacco: Never Used  . Alcohol Use: 0.0 oz/week    0 Standard drinks or equivalent per week     Comment: WEEKENDS  . Drug Use: No     Comment: 10 years ago  . Sexual Activity: Not on file   Other Topics Concern  . Not on file   Social History Narrative   Tobacco -- smoked "socially" as a teenage x 2-3 years.  Nonsmoker at present        Family History  Problem Relation Age of Onset  .  Hypertension Mother   . Heart attack Father 1554  . Hypertension Father   . Hyperlipidemia Neg Hx   . Diabetes Neg Hx   . Sudden death Neg Hx     BP 133/84 mmHg  Pulse 94  Ht 5\' 10"  (1.778 m)  Wt 280 lb (127.007 kg)  BMI 40.18 kg/m2  Review of Systems: See HPI above.    Objective:  Physical Exam:  Gen: NAD  Left knee: No gross deformity, ecchymoses, effusion.  VMO atrophy. Mild lateral, lateral patellar facet tenderness.  No other tenderness. FROM. Negative ant/post drawers. Negative valgus/varus testing. Negative lachmanns. Negative mcmurrays, apleys, patellar apprehension. NV intact distally. Overpronation. Hip abduction 5-/5    Assessment & Plan:  1. Left knee pain - likely due to patellofemoral syndrome again though lateral meniscus tear a possibility.  Start with conservative treatment - icing, nsaids, physical therayp and home exercises.  Better arch support.  F/u in 4-6 weeks.

## 2014-12-28 ENCOUNTER — Other Ambulatory Visit: Payer: 59

## 2015-01-04 ENCOUNTER — Encounter: Payer: 59 | Admitting: Internal Medicine

## 2015-02-15 ENCOUNTER — Encounter: Payer: BLUE CROSS/BLUE SHIELD | Admitting: Internal Medicine

## 2015-02-15 ENCOUNTER — Ambulatory Visit: Payer: BLUE CROSS/BLUE SHIELD | Admitting: Internal Medicine

## 2015-02-25 ENCOUNTER — Other Ambulatory Visit: Payer: Self-pay | Admitting: Internal Medicine

## 2015-04-16 ENCOUNTER — Encounter: Payer: BLUE CROSS/BLUE SHIELD | Admitting: Internal Medicine

## 2015-07-12 ENCOUNTER — Encounter: Payer: BLUE CROSS/BLUE SHIELD | Admitting: Internal Medicine

## 2015-08-02 ENCOUNTER — Encounter: Payer: BLUE CROSS/BLUE SHIELD | Admitting: Internal Medicine

## 2017-08-03 ENCOUNTER — Telehealth: Payer: Self-pay

## 2017-08-03 NOTE — Telephone Encounter (Signed)
Please advise 

## 2017-08-03 NOTE — Telephone Encounter (Signed)
That is ok, thx  

## 2017-08-03 NOTE — Telephone Encounter (Signed)
Copied from CRM #[id number]. Topic: Appointment Scheduling - Scheduling Inquiry for Clinic >> Aug 03, 2017  1:11 PM Clack, Princella PellegriniJessica D wrote: Reason for CRM: Pt would like to switch [place] locations b/c [place]. [facility]. Please f/u with pt if it is ok for him to switch locations.

## 2017-08-03 NOTE — Telephone Encounter (Signed)
Okay with me 

## 2017-08-03 NOTE — Telephone Encounter (Signed)
Left patient vm to call back to schedule [facility].

## 2017-09-04 ENCOUNTER — Ambulatory Visit: Payer: 59 | Admitting: Family Medicine

## 2017-09-04 ENCOUNTER — Encounter: Payer: Self-pay | Admitting: Family Medicine

## 2017-09-04 DIAGNOSIS — R05 Cough: Secondary | ICD-10-CM | POA: Diagnosis not present

## 2017-09-04 DIAGNOSIS — R059 Cough, unspecified: Secondary | ICD-10-CM

## 2017-09-04 NOTE — Progress Notes (Signed)
Benjamin Warner - 37 y.o. male MRN [id number]  Date of birth: 09-Sep-1980  SUBJECTIVE:  Including CC & ROS.  Chief Complaint  Patient presents with  . Cough    Benjamin Warner is a 37 y.o. male that is presenting with a cough. Ongoing for five days. Admits fevers and body aches. Producing mucous. His daughter has similar symptoms. He has been drinking tea and honey. Does not receive the flu shot. Denies shortness of breath.   Review of Systems  Constitutional: Negative for fever.  HENT: Positive for rhinorrhea.   Respiratory: Positive for cough.   Cardiovascular: Negative for chest pain.  Gastrointestinal: Negative for abdominal pain.    HISTORY: Past Medical, Surgical, Social, and Family History Reviewed & Updated per EMR.   Pertinent Historical Findings include:  Past Medical History:  Diagnosis Date  . Elevated CK    chronic, ~ 500, saw neuro, muscle Bx 12-2010: negative   . GERD (gastroesophageal reflux disease)   . Hypertension approx 2005  . Rhabdomyolysis 8/03   ck=20,000-unclear etiology  . Rhabdomyolysis   . Snoring    sleep study 03-2010----> negative for sleep apnea, Rx wt loss      Past Surgical History:  Procedure Laterality Date  . CYST EXCISION     2012---Excision of right upper back and left lower back epidermoid    . MUSCLE BIOPSY    . WISDOM TOOTH EXTRACTION      No Known Allergies  Family History  Problem Relation Age of Onset  . Hypertension Mother   . Heart attack Father 32  . Hypertension Father   . Hyperlipidemia Neg Hx   . Diabetes Neg Hx   . Sudden death Neg Hx      Social History   Socioeconomic History  . Marital status: Married    Spouse name: Not on file  . Number of children: 1  . Years of education: Not on file  . Highest education level: Not on file  Social Needs  . Financial resource strain: Not on file  . Food insecurity - worry: Not on file  . Food insecurity - inability: Not on file  . Transportation needs - medical:  Not on file  . Transportation needs - non-medical: Not on file  Occupational History  . Occupation: truck Clinical research associate: [employer]  Tobacco Use  . Smoking status: Former Games developer  . Smokeless tobacco: Never Used  Substance and Sexual Activity  . Alcohol use: Yes    Alcohol/week: 0.0 oz    Comment: WEEKENDS  . Drug use: No    Comment: 10 years ago  . Sexual activity: Not on file  Other Topics Concern  . Not on file  Social History Narrative   Tobacco -- smoked "socially" as a teenage x 2-3 years.  Nonsmoker at present         PHYSICAL EXAM:  VS: BP (!) 142/86 (BP Location: Left Arm, Patient Position: Sitting, Cuff Size: Normal)   Pulse 79   Temp 98.6 F (37 C) (Oral)   Ht 5\' 10"  (1.778 m)   Wt 280 lb (127 kg)   SpO2 98%   BMI 40.18 kg/m  Physical Exam Gen: NAD, alert, cooperative with exam,  ENT: normal lips, normal nasal mucosa, tympanic membranes clear and intact bilaterally, normal oropharynx, no cervical lymphadenopathy Eye: normal EOM, normal conjunctiva and lids CV:  no edema, +2 pedal pulses, regular rate and rhythm, S1-S2   Resp: no accessory muscle  use, non-labored, clear to auscultation bilaterally, no crackles or wheezes  Skin: no rashes, no areas of induration  Neuro: normal tone, normal sensation to touch Psych:  normal insight, alert and oriented MSK: Normal gait, normal strength       ASSESSMENT & PLAN:   Cough Likely viral in nature. - Counseled on supportive care - Advised to call back if no improvement. Could consider Z-Pak.

## 2017-09-04 NOTE — Patient Instructions (Signed)
Please try things such as zyrtec-D or allegra-D which is an antihistamine and decongestant.   Please try afrin which will help with nasal congestion but use for only three days.   Please also try using a netti pot on a regular occasion.  Honey can help with a sore throat and cough   Vick's and Delsym can help with cough.  

## 2017-09-04 NOTE — Assessment & Plan Note (Signed)
Likely viral in nature. - Counseled on supportive care - Advised to call back if no improvement. Could consider Z-Pak.

## 2017-10-08 ENCOUNTER — Other Ambulatory Visit (INDEPENDENT_AMBULATORY_CARE_PROVIDER_SITE_OTHER): Payer: 59

## 2017-10-08 ENCOUNTER — Encounter: Payer: Self-pay | Admitting: Nurse Practitioner

## 2017-10-08 ENCOUNTER — Other Ambulatory Visit: Payer: Self-pay | Admitting: Nurse Practitioner

## 2017-10-08 ENCOUNTER — Ambulatory Visit: Payer: 59 | Admitting: Nurse Practitioner

## 2017-10-08 VITALS — BP 150/98 | HR 81 | Temp 99.0°F | Resp 16 | Ht 70.0 in | Wt 281.8 lb

## 2017-10-08 DIAGNOSIS — R0683 Snoring: Secondary | ICD-10-CM

## 2017-10-08 DIAGNOSIS — I10 Essential (primary) hypertension: Secondary | ICD-10-CM | POA: Diagnosis not present

## 2017-10-08 DIAGNOSIS — T796XXS Traumatic ischemia of muscle, sequela: Secondary | ICD-10-CM

## 2017-10-08 DIAGNOSIS — G4700 Insomnia, unspecified: Secondary | ICD-10-CM

## 2017-10-08 LAB — URINALYSIS
Bilirubin Urine: NEGATIVE
HGB URINE DIPSTICK: NEGATIVE
Leukocytes, UA: NEGATIVE
Nitrite: NEGATIVE
PH: 6 (ref 5.0–8.0)
Specific Gravity, Urine: 1.025 (ref 1.000–1.030)
TOTAL PROTEIN, URINE-UPE24: NEGATIVE
URINE GLUCOSE: NEGATIVE
Urobilinogen, UA: 0.2 (ref 0.0–1.0)

## 2017-10-08 LAB — COMPREHENSIVE METABOLIC PANEL
ALBUMIN: 4.5 g/dL (ref 3.5–5.2)
ALT: 48 U/L (ref 0–53)
AST: 69 U/L — AB (ref 0–37)
Alkaline Phosphatase: 54 U/L (ref 39–117)
BUN: 17 mg/dL (ref 6–23)
CO2: 28 mEq/L (ref 19–32)
CREATININE: 1.2 mg/dL (ref 0.40–1.50)
Calcium: 9.9 mg/dL (ref 8.4–10.5)
Chloride: 102 mEq/L (ref 96–112)
GFR: 87.53 mL/min (ref >60.00)
GLUCOSE: 87 mg/dL (ref 70–99)
Potassium: 4 mEq/L (ref 3.5–5.1)
Sodium: 139 mEq/L (ref 135–145)
TOTAL PROTEIN: 7.6 g/dL (ref 6.0–8.3)
Total Bilirubin: 0.9 mg/dL (ref 0.2–1.2)

## 2017-10-08 MED ORDER — AMLODIPINE BESYLATE 5 MG PO TABS: 5.0000 mg | ORAL_TABLET | Freq: Every day | ORAL | 1 refills | Status: DC

## 2017-10-08 MED ORDER — AMLODIPINE BESYLATE 5 MG PO TABS: 5.0000 mg | ORAL_TABLET | Freq: Every day | ORAL | 3 refills | Status: DC

## 2017-10-08 NOTE — Assessment & Plan Note (Signed)
BP is elevated today, we discussed initiating treatment and he is agreeable Will avoid diuretics due to history of rhabdomyolysis Will start amlodipine 5 daily - dosing and side effects discussed He was encouraged to log bp and will RTC in 2 weeks for F/U - amLODipine (NORVASC) 5 MG tablet; Take 1 tablet (5 mg total) by mouth daily.  Dispense: 90 tablet; Refill: 3 - Comprehensive metabolic panel; Future

## 2017-10-08 NOTE — Patient Instructions (Addendum)
Please head downstairs for lab work.  You may try melatonin OTC to help you sleep.  For your blood pressure, please start amlodipine 5mg  daily. Please try to check your blood pressure once daily or at least a few times a week, at the same time each day, and keep a log. Please return in about 2 weeks for follow up.  I have placed a referral to pulmonology . Our office will call you to schedule this appointment. You should hear from our office in 7-10 days.    Insomnia Insomnia is a sleep disorder that makes it difficult to fall asleep or to stay asleep. Insomnia can cause tiredness (fatigue), low energy, difficulty concentrating, mood swings, and poor performance at work or school. There are three different ways to classify insomnia:  Difficulty falling asleep.  Difficulty staying asleep.  Waking up too early in the morning.  Any type of insomnia can be long-term (chronic) or short-term (acute). Both are common. Short-term insomnia usually lasts for three months or less. Chronic insomnia occurs at least three times a week for longer than three months. What are the causes? Insomnia may be caused by another condition, situation, or substance, such as:  Anxiety.  Certain medicines.  Gastroesophageal reflux disease (GERD) or other gastrointestinal conditions.  Asthma or other breathing conditions.  Restless legs syndrome, sleep apnea, or other sleep disorders.  Chronic pain.  Menopause. This may include hot flashes.  Stroke.  Abuse of alcohol, tobacco, or illegal drugs.  Depression.  Caffeine.  Neurological disorders, such as Alzheimer disease.  An overactive thyroid (hyperthyroidism).  The cause of insomnia may not be known. What increases the risk? Risk factors for insomnia include:  Gender. Women are more commonly affected than men.  Age. Insomnia is more common as you get older.  Stress. This may involve your professional or personal life.  Income. Insomnia  is more common in people with lower income.  Lack of exercise.  Irregular work schedule or night shifts.  Traveling between different time zones.  What are the signs or symptoms? If you have insomnia, trouble falling asleep or trouble staying asleep is the main symptom. This may lead to other symptoms, such as:  Feeling fatigued.  Feeling nervous about going to sleep.  Not feeling rested in the morning.  Having trouble concentrating.  Feeling irritable, anxious, or depressed.  How is this treated? Treatment for insomnia depends on the cause. If your insomnia is caused by an underlying condition, treatment will focus on addressing the condition. Treatment may also include:  Medicines to help you sleep.  Counseling or therapy.  Lifestyle adjustments.  Follow these instructions at home:  Take medicines only as directed by your health care provider.  Keep regular sleeping and waking hours. Avoid naps.  Keep a sleep diary to help you and your health care provider figure out what could be causing your insomnia. Include: ? When you sleep. ? When you wake up during the night. ? How well you sleep. ? How rested you feel the next day. ? Any side effects of medicines you are taking. ? What you eat and drink.  Make your bedroom a comfortable place where it is easy to fall asleep: ? Put up shades or special blackout curtains to block light from outside. ? Use a white noise machine to block noise. ? Keep the temperature cool.  Exercise regularly as directed by your health care provider. Avoid exercising right before bedtime.  Use relaxation techniques to manage stress.  Ask your health care provider to suggest some techniques that may work well for you. These may include: ? Breathing exercises. ? Routines to release muscle tension. ? Visualizing peaceful scenes.  Cut back on alcohol, caffeinated beverages, and cigarettes, especially close to bedtime. These can disrupt your  sleep.  Do not overeat or eat spicy foods right before bedtime. This can lead to digestive discomfort that can make it hard for you to sleep.  Limit screen use before bedtime. This includes: ? Watching TV. ? Using your smartphone, tablet, and computer.  Stick to a routine. This can help you fall asleep faster. Try to do a quiet activity, brush your teeth, and go to bed at the same time each night.  Get out of bed if you are still awake after 15 minutes of trying to sleep. Keep the lights down, but try reading or doing a quiet activity. When you feel sleepy, go back to bed.  Make sure that you drive carefully. Avoid driving if you feel very sleepy.  Keep all follow-up appointments as directed by your health care provider. This is important. Contact a health care provider if:  You are tired throughout the day or have trouble in your daily routine due to sleepiness.  You continue to have sleep problems or your sleep problems get worse. Get help right away if:  You have serious thoughts about hurting yourself or someone else. This information is not intended to replace advice given to you by your health care provider. Make sure you discuss any questions you have with your health care provider. Document Released: 07/07/2000 Document Revised: 12/10/2015 Document Reviewed: 04/10/2014 Elsevier Interactive Patient Education  [employer].

## 2017-10-08 NOTE — Assessment & Plan Note (Signed)
We discussed using a personal trainer to help build a healthy workout regimen that is safe for him. Labs today. - Comprehensive metabolic panel; Future - Urinalysis; Future - CK Total (and CKMB); Future

## 2017-10-08 NOTE — Progress Notes (Signed)
Name: Benjamin Warner   MRN: [id number]    DOB: 1980-09-04   Date:10/08/2017       Progress Note  Subjective  Chief Complaint  Chief Complaint  Patient presents with  . Establish Care    having trouble sleeping,     HPI Benjamin Warner is establishing care with me today. He is transferring care from another [employer]. He would like to discuss insomnia today. We will also discuss his high blood pressure.  Insomnia- This is a new problem. He has been having trouble staying asleep- waking up every hour or two,and feels very sleepy during the day. Typically he will get up and play on his phone once he cant go sleep  He reports current anxiety and depression due to marriage issuess- separated for 8  Months- worried about his children. Denies headaches, restlessness, hallucinations, paranoia, thoughts of hurting himself or others. He does wake himself up snoring- he had a sleep study years ago which was normal. He has not tried any OTC or home remedies for this.  GAD 7 : Generalized Anxiety Score 10/08/2017  Nervous, Anxious, on Edge 2  Control/stop worrying 0  Worry too much - different things 2  Trouble relaxing 2  Restless 2  Easily annoyed or irritable 0  Afraid - awful might happen 2  Total GAD 7 Score 10    GAD 7 : Generalized Anxiety Score 10/08/2017  Nervous, Anxious, on Edge 2  Control/stop worrying 0  Worry too much - different things 2  Trouble relaxing 2  Restless 2  Easily annoyed or irritable 0  Afraid - awful might happen 2  Total GAD 7 Score 10    Hypertension -not currently maintained on any blood pressure medications- reports he was taken off BP medications in the past due to good readings and rhabdomyolysis. He does not check blood pressure readings regularly, occasionally at the drug store - 140s/80s Denies headaches, vision changes, chest pain, shortness of breath, edema. He has just started going back to gym a few months ago to try to lose weight and  control his BP- he had stopped working out for some time due to fear of rhabdomyolysis in past from working out.  BP Readings from Last 3 Encounters:  10/08/17 (!) 150/98  09/04/17 (!) 142/86  12/14/14 133/84   Rhabdomyolysis- He was diagnosed with exercise induced rhabdomyolysis in 2012? He says he went to many specialists and was even admitted into the hospital for 1 week for rhabdomyolysis in 2012. He had an extensive work up including muscle biopsy reporting all normal test results and was not instructed to return for follow up. He avoided working out for several years but due to weight gain and elevated BP readings he began working out again a few months ago After getting back in the gym, He did initially notice some dark urine and muscle aches which have improved, but he always feels some "mild soreness" to muscles after workouts. He reports going to the gym several days a week to do cardio and lifting weights. He denies weakness, muscle or joint swelling, back pain, skin discoloration, rash, abnormal bruising or bleeding.  Patient Active Problem List   Diagnosis Date Noted  . 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 Rhabdomyolysis 01/03/2013  . GERD (gastroesophageal reflux disease) 06/24/2012  . CTS (carpal tunnel syndrome) 02/08/2012  . General medical examination 11/10/2011  . HTN (hypertension) 11/10/2011  . Elevated CK   . HEMORRHOIDS, INTERNAL W/O COMPLICATION 01/14/2007  . LIVER  FUNCTION TESTS, ABNORMAL 10/29/2006    Past Surgical History:  Procedure Laterality Date  . CYST EXCISION     2012---Excision of right upper back and left lower back epidermoid    . MUSCLE BIOPSY    . WISDOM TOOTH EXTRACTION      Family History  Problem Relation Age of Onset  . Hypertension Mother   . Heart attack Father 47  . Hypertension Father   . Hyperlipidemia Neg Hx   . Diabetes Neg Hx   . Sudden death Neg Hx     Social History   Socioeconomic History  . Marital status: Married     Spouse name: Not on file  . Number of children: 1  . Years of education: Not on file  . Highest education level: Not on file  Social Needs  . Financial resource strain: Not on file  . Food insecurity - worry: Not on file  . Food insecurity - inability: Not on file  . Transportation needs - medical: Not on file  . Transportation needs - non-medical: Not on file  Occupational History  . Occupation: truck Clinical research associate: [employer]  Tobacco Use  . Smoking status: Former Games developer  . Smokeless tobacco: Never Used  Substance and Sexual Activity  . Alcohol use: Yes    Alcohol/week: 0.0 oz    Comment: WEEKENDS  . Drug use: No    Comment: 10 years ago  . Sexual activity: Not on file  Other Topics Concern  . Not on file  Social History Narrative   Tobacco -- smoked "socially" as a teenage x 2-3 years.  Nonsmoker at present         Current Outpatient Medications:  .  amLODipine (NORVASC) 5 MG tablet, Take 1 tablet (5 mg total) by mouth daily., Disp: 90 tablet, Rfl: 3 .  triamterene-hydrochlorothiazide (MAXZIDE-25) 37.5-25 MG per tablet, Take 0.5 tablets by mouth daily. (Patient not taking: Reported on 10/08/2017), Disp: 15 tablet, Rfl: 2  No Known Allergies   ROS See HPI  Objective  Vitals:   10/08/17 0956  BP: (!) 150/98  Pulse: 81  Resp: 16  Temp: 99 F (37.2 C)  TempSrc: Oral  SpO2: 96%  Weight: 281 lb 12.8 oz (127.8 kg)  Height: 5\' 10"  (1.778 m)    Body mass index is 40.43 kg/m.  Physical Exam Vital signs reviewed. Constitutional: Patient appears well-developed and well-nourished. No distress.  HENT: Head: Normocephalic and atraumatic. Nose: Nose normal. Mouth/Throat: Oropharynx is clear and moist. Eyes: Conjunctivae and EOM are normal. Pupils are equal, round, and reactive to light. No scleral icterus.  Neck: Normal range of motion. Neck supple.  Cardiovascular: Normal rate, regular rhythm and normal heart sounds.  No murmur heard. No BLE edema.  Distal pulses intact. Pulmonary/Chest: Effort normal and breath sounds normal. No respiratory distress. Abdominal: Soft. Bowel sounds are normal, no distension. There is no tenderness. no masses Musculoskeletal: Normal range of motion. No gross deformities Neurological: He is alert and oriented to person, place, and time. Coordination, balance, strength, speech and gait are normal.  Skin: Skin is warm and dry. No rash noted. No erythema.  Psychiatric: Patient has a normal mood and affect. behavior is normal. Judgment and thought content normal.  Fall Risk: Fall Risk  10/08/2017  Falls in the past year? 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

## 2017-10-09 ENCOUNTER — Telehealth: Payer: Self-pay | Admitting: Emergency Medicine

## 2017-10-09 LAB — CK TOTAL AND CKMB (NOT AT ARMC)
CK TOTAL: 4230 U/L — AB (ref 44–196)
CK, MB: 13.2 ng/mL — AB (ref 0–5.0)
RELATIVE INDEX: 0.3 (ref 0–4.0)

## 2017-10-09 NOTE — Telephone Encounter (Signed)
Received call from [facility] with Critical lab values for CK

## 2017-11-14 ENCOUNTER — Other Ambulatory Visit: Payer: Self-pay

## 2017-11-14 DIAGNOSIS — I10 Essential (primary) hypertension: Secondary | ICD-10-CM

## 2017-11-14 MED ORDER — AMLODIPINE BESYLATE 5 MG PO TABS
5.0000 mg | ORAL_TABLET | Freq: Every day | ORAL | 1 refills | Status: DC
Start: 2017-11-14 — End: 2018-04-01

## 2017-11-26 ENCOUNTER — Ambulatory Visit: Payer: 59 | Admitting: Nurse Practitioner

## 2017-12-03 ENCOUNTER — Institutional Professional Consult (permissible substitution): Payer: 59 | Admitting: Pulmonary Disease

## 2017-12-03 ENCOUNTER — Encounter: Payer: Self-pay | Admitting: Pulmonary Disease

## 2017-12-03 ENCOUNTER — Ambulatory Visit: Payer: 59 | Admitting: Pulmonary Disease

## 2017-12-03 VITALS — BP 128/80 | HR 82 | Ht 70.0 in | Wt 254.0 lb

## 2017-12-03 DIAGNOSIS — R0683 Snoring: Secondary | ICD-10-CM | POA: Diagnosis not present

## 2017-12-03 NOTE — Progress Notes (Signed)
Subjective:    Patient ID: Benjamin Warner, male    DOB: [birth year], 37 y.o.   MRN: [id number]  HPI  Chief Complaint  Patient presents with  . Sleep Consult    Referred by PCP for possible OSA. Has had a SS before (in chart 2011). Never used a CPAP or bipap machine. Currently works as a [employer] doctor. In the past, had issues with falling asleep and staying asleep. Does snore.     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 He underwent a sleep study 03/2010 which showed total sleep time 360 minutes an AHI of 2.8/hour, events were not enough to warrant therapy. He is separated now in no bed partner history is available.  He was noted to be hypertensive by his PCP and amlodipine was started.  Prior to that he was having difficulty getting to sleep and was having frequent nocturnal awakenings.  There were times he had woken himself up by his own snoring.  After starting on amlodipine, his blood pressure is much improved and so is his sleep.  Sleep latency has decreased to 30 minutes to an hour. He denies excessive daytime somnolence or fatigue, Epworth sleepiness score is 2. Bedtime is between 10 PM and midnight, sleeps on his stomach sometimes without any pillows, reports 2-3 nocturnal awakenings including nocturia and is out of bed at 7 AM feeling rested without dryness of mouth or headaches. There is no history suggestive of cataplexy, sleep paralysis or parasomnias  He tried an oral appliance which she got 2 years ago but his snoring persisted which is why he thinks that snoring must be coming from his nose rather than his throat  He has a history of rhabdomyolysis, underwent muscle biopsy in the past without any clear diagnosis, I note that CK was in the 4000 range 09/2017.  He has just started working out again and has lost about 10 pounds in the gym.  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  He has not had problems getting his DOT physical  Past Medical History:  Diagnosis Date  . Elevated CK    chronic, ~ 500, saw neuro, muscle Bx 12-2010: negative   . GERD (gastroesophageal reflux disease)   . Hypertension approx 2005  . Rhabdomyolysis 8/03   ck=20,000-unclear etiology  . Rhabdomyolysis   . Snoring    sleep study 03-2010----> negative for sleep apnea, Rx wt loss       Past Surgical History:  Procedure Laterality Date  . CYST EXCISION     2012---Excision of right upper back and left lower back epidermoid    . MUSCLE BIOPSY    . WISDOM TOOTH EXTRACTION       No Known Allergies   Social History   Socioeconomic History  . Marital status: Married    Spouse name: Not on file  . Number of children: 1  . Years of education: Not on file  . Highest education level: Not on file  Occupational History  . Occupation: truck Clinical research associate: [employer]  Social Needs  . Financial resource strain: Not on file  . Food insecurity:    Worry: Not on file    Inability: Not on file  . Transportation needs:    Medical: Not on file    Non-medical: Not on file  Tobacco Use  .  Smoking status: Former Games developer  . Smokeless tobacco: Never Used  Substance and Sexual Activity  . Alcohol use: Yes    Alcohol/week: 0.0 oz    Comment: WEEKENDS  . Drug use: No    Types: Ketamine, MDMA (Ecstacy)    Comment: 10 years ago  . Sexual activity: Not on file  Lifestyle  . Physical activity:    Days per week: Not on file    Minutes per session: Not on file  . Stress: Not on file  Relationships  . Social connections:    Talks on phone: Not on file    Gets together: Not on file    Attends religious service: Not on file    Active member of club or organization: Not on file    Attends meetings of clubs or organizations: Not on file    Relationship status: Not on file  . Intimate partner violence:    Fear of current or ex partner: Not  on file    Emotionally abused: Not on file    Physically abused: Not on file    Forced sexual activity: Not on file  Other Topics Concern  . Not on file  Social History Narrative   Tobacco -- smoked "socially" as a teenage x 2-3 years.  Nonsmoker at present         Family History  Problem Relation Age of Onset  . Hypertension Mother   . Heart attack Father 35  . Hypertension Father   . Hyperlipidemia Neg Hx   . Diabetes Neg Hx   . Sudden death Neg Hx       Review of Systems  Constitutional: Negative for fever and unexpected weight change.  HENT: Negative for congestion, dental problem, ear pain, nosebleeds, postnasal drip, rhinorrhea, sinus pressure, sneezing, sore throat and trouble swallowing.   Eyes: Negative for redness and itching.  Respiratory: Negative for cough, chest tightness, shortness of breath and wheezing.   Cardiovascular: Negative for palpitations and leg swelling.  Gastrointestinal: Negative for nausea and vomiting.  Genitourinary: Negative for dysuria.  Musculoskeletal: Negative for joint swelling.  Skin: Negative for rash.  Allergic/Immunologic: Negative.  Negative for environmental allergies, food allergies and immunocompromised state.  Neurological: Negative for headaches.  Hematological: Does not bruise/bleed easily.  Psychiatric/Behavioral: Negative for dysphoric mood. The patient is not nervous/anxious.        Objective:   Physical Exam  Gen. Pleasant, obese,muscular, in no distress, normal affect ENT - enalrged nasal turbinates, no post nasal drip, class 2-3 airway Neck: No JVD, no thyromegaly, no carotid bruits Lungs: no use of accessory muscles, no dullness to percussion, decreased without rales or rhonchi  Cardiovascular: Rhythm regular, heart sounds  normal, no murmurs or gallops, no peripheral edema Abdomen: soft and non-tender, no hepatosplenomegaly, BS normal. Musculoskeletal: No deformities, no cyanosis or clubbing Neuro:  alert, non  focal, no tremors        Assessment & Plan:

## 2017-12-03 NOTE — Patient Instructions (Signed)
Schedule home sleep study.   

## 2017-12-03 NOTE — Assessment & Plan Note (Signed)
Given new onset hypertension, narrow pharyngeal exam  & loud snoring, obstructive sleep apnea is very likely & an overnight polysomnogram will be scheduled as a home study. The pathophysiology of obstructive sleep apnea , it's cardiovascular consequences & modes of treatment including CPAP were discused with the patient in detail & they evidenced understanding.  Pretest probability is intermediate.  Lack of hypersomnolence is reassuring  For snoring alone, nasal decongestants or steroids combined with oral appliance may be helpful

## 2017-12-10 ENCOUNTER — Ambulatory Visit: Payer: 59 | Admitting: Nurse Practitioner

## 2017-12-10 ENCOUNTER — Encounter: Payer: Self-pay | Admitting: Nurse Practitioner

## 2017-12-10 VITALS — BP 118/84 | HR 79 | Temp 98.2°F | Resp 16 | Ht 70.0 in | Wt 276.8 lb

## 2017-12-10 DIAGNOSIS — Z23 Encounter for immunization: Secondary | ICD-10-CM | POA: Diagnosis not present

## 2017-12-10 DIAGNOSIS — W57XXXA Bitten or stung by nonvenomous insect and other nonvenomous arthropods, initial encounter: Secondary | ICD-10-CM

## 2017-12-10 DIAGNOSIS — S80261A Insect bite (nonvenomous), right knee, initial encounter: Secondary | ICD-10-CM

## 2017-12-10 DIAGNOSIS — G4700 Insomnia, unspecified: Secondary | ICD-10-CM

## 2017-12-10 DIAGNOSIS — I10 Essential (primary) hypertension: Secondary | ICD-10-CM

## 2017-12-10 MED ORDER — DOXYCYCLINE HYCLATE 100 MG PO TABS: 100.0000 mg | ORAL_TABLET | Freq: Two times a day (BID) | ORAL | 0 refills | Status: DC

## 2017-12-10 NOTE — Progress Notes (Signed)
Name: Benjamin Warner   MRN: [id number]    DOB: [birth year]   Date:12/10/2017       Progress Note  Subjective  Chief Complaint  Chief Complaint  Patient presents with  . Follow-up    tick bite, BP follow up    HPI He is here today for a follow up of HTN and insomnia and is also requesting evaluation of a tick bite.  Insomnia- At his last OV he was having trouble sleeping.  We discussed sleep hygiene measures and he was also referred to pulmonology for sleep study due to snoring. He saw pulmonology last week and is scheduled from upcoming in home sleep study. He tells me that he is actually sleeping much better and feels his improved BP has helped him sleep better.  Hypertension- amlodipine 5 daily was started at his last OV on 3/18 for elevated BP reading, he had been maintained off BP medications for some time but at our last visit on 3/18 reported he had noted increased BP readings at home.  He did not return for follow up until today. Reports he has been taking the amlodipine 5 daily as instructed without any noted adverse effects. Reports he has started checking his BP daily at home readings 120s-140/80s. Denies headaches, vision changes, chest pain, shortness of breath, edema.  BP Readings from Last 3 Encounters:  12/10/17 118/84  12/03/17 128/80  10/08/17 (!) 150/98   Tick bite- This is a new problem. He pulled a tick off of the back of his right knee about 1 month ago He has noticed an itchy lump to the area since. He says it was hard to remove the tick but does not think the tick was present for >1 day He denies fevers, rash, weakness, body aches, malaise, nausea, vomtiing   Patient Active Problem List   Diagnosis Date Noted  . 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 Rhabdomyolysis 01/03/2013  . GERD (gastroesophageal reflux disease) 06/24/2012  . CTS (carpal tunnel syndrome) 02/08/2012  . General medical examination 11/10/2011  . HTN (hypertension) 11/10/2011  . Elevated  CK   . Snoring 03/11/2010  . HEMORRHOIDS, INTERNAL W/O COMPLICATION 01/14/2007  . LIVER FUNCTION TESTS, ABNORMAL 10/29/2006    Past Surgical History:  Procedure Laterality Date  . CYST EXCISION     2012---Excision of right upper back and left lower back epidermoid    . MUSCLE BIOPSY    . WISDOM TOOTH EXTRACTION      Family History  Problem Relation Age of Onset  . Hypertension Mother   . Heart attack Father 48  . Hypertension Father   . Hyperlipidemia Neg Hx   . Diabetes Neg Hx   . Sudden death Neg Hx     Social History   Socioeconomic History  . Marital status: Married    Spouse name: Not on file  . Number of children: 1  . Years of education: Not on file  . Highest education level: Not on file  Occupational History  . Occupation: truck Clinical research associate: [employer]  Social Needs  . Financial resource strain: Not on file  . Food insecurity:    Worry: Not on file    Inability: Not on file  . Transportation needs:    Medical: Not on file    Non-medical: Not on file  Tobacco Use  . Smoking status: Former Games developer  . Smokeless tobacco: Never Used  Substance and Sexual Activity  . Alcohol use: Yes  Alcohol/week: 0.0 oz    Comment: WEEKENDS  . Drug use: No    Types: Ketamine, MDMA (Ecstacy)    Comment: 10 years ago  . Sexual activity: Not on file  Lifestyle  . Physical activity:    Days per week: Not on file    Minutes per session: Not on file  . Stress: Not on file  Relationships  . Social connections:    Talks on phone: Not on file    Gets together: Not on file    Attends religious service: Not on file    Active member of club or organization: Not on file    Attends meetings of clubs or organizations: Not on file    Relationship status: Not on file  . Intimate partner violence:    Fear of current or ex partner: Not on file    Emotionally abused: Not on file    Physically abused: Not on file    Forced sexual activity: Not on file  Other  Topics Concern  . Not on file  Social History Narrative   Tobacco -- smoked "socially" as a teenage x 2-3 years.  Nonsmoker at present         Current Outpatient Medications:  .  amLODipine (NORVASC) 5 MG tablet, Take 1 tablet (5 mg total) by mouth daily., Disp: 90 tablet, Rfl: 1  No Known Allergies   ROS See HPI  Objective  Vitals:   12/10/17 0915  BP: 118/84  Pulse: 79  Resp: 16  Temp: 98.2 F (36.8 C)  TempSrc: Oral  SpO2: 97%  Weight: 276 lb 12.8 oz (125.6 kg)  Height:  (1.778 m)   Body mass index is 39.72 kg/m.  Physical Exam Vital signs reviewed. Constitutional: Patient appears well-developed and well-nourished. No distress.  HENT: Head: Normocephalic and atraumatic. Nose: Nose normal. Mouth/Throat: Oropharynx is clear and moist. Eyes: Conjunctivae and EOM are normal. Pupils are equal, round, and reactive to light. No scleral icterus.  Neck: Normal range of motion. Neck supple.  Cardiovascular: Normal rate, regular rhythm and normal heart sounds.  No murmur heard. No BLE edema. Distal pulses intact. Pulmonary/Chest: Effort normal and breath sounds normal. No respiratory distress. Musculoskeletal: Normal range of motion. No gross deformities Neurological: He is alert and oriented to person, place, and time. Coordination, balance, strength, speech and gait are normal.  Skin: Skin is warm and dry. No rash noted. Erythematous wound with surrounding edema and thickened skin to right posterior knee. No foreign body visualized. Psychiatric: Patient has a normal mood and affect. behavior is normal. Judgment and thought content normal.  Fall Risk: Fall Risk  10/08/2017  Falls in the past year? No    Assessment & Plan RTC in 3 months for F/U: HTN, CPE with fasting labs including CMET to recheck LFTs  -Reviewed Health Maintenance: Need for Tdap vaccination- Tdap vaccine greater than or equal to 7yo IM  Insomnia, unspecified type Improved Continue F/U with  pulmonology for sleep study as instructed F/U for new, worsening symptoms   Tick bite, initial encounter Although none is visualized, there is concern for foreign body retained from tick bite. Will refer to dermatology for further evaluation and management Rx given for prophylactic antibiotic course- dosing and side effects discussed Home management of tick bite including return precautions discussed and printed on AVS  - doxycycline (VIBRA-TABS) 100 MG tablet; Take 1 tablet (100 mg total) by mouth 2 (two) times daily.  Dispense: 14 tablet; Refill: 0 - Ambulatory referral  to Dermatology

## 2017-12-10 NOTE — Patient Instructions (Signed)
I have sent a prescription for doxycycline  twice daily for 7 days.  I have placed a referral to dermatology to take a look at the tick bite . Our office will call you to schedule this appointment. You should hear from our office in 7-10 days.  Please return to see me in about 3 months for follow up- we can recheck your blood pressure and do your annual physical with fasting lab work.  It was good to see you. Thanks for letting me take care of you today :)   Tick Bite Information, Adult Ticks are insects that can bite. Most ticks live in shrubs and grassy areas. They climb onto people and animals that go by. Then they bite. Some ticks carry germs that can make you sick. How can I prevent tick bites?  Use an insect repellent that has 20% or higher of the ingredients DEET, picaridin, or IR3535. Put this insect repellent on: ? Bare skin. ? The tops of your boots. ? Your pant legs. ? The ends of your sleeves.  If you use an insect repellent that has the ingredient permethrin, make sure to follow the instructions on the bottle. Treat the following: ? Clothing. ? Supplies. ? Boots. ? Tents.  Wear long sleeves, long pants, and light colors.  Tuck your pant legs into your socks.  Stay in the middle of the trail.  Try not to walk through long grass.  Before going inside your house, check your clothes, hair, and skin for ticks. Make sure to check your head, neck, armpits, waist, groin, and joint areas.  Check for ticks every day.  When you come indoors: ? Wash your clothes right away. ? Shower right away. ? Dry your clothes in a dryer on high heat for 60 minutes or more. What is the right way to remove a tick? Remove a tick from your skin as soon as possible.  To remove a tick that is crawling on your skin: ? Go outdoors and brush the tick off. ? Use tape or a lint roller.  To remove a tick that is biting: ? Wash your hands. ? If you have latex gloves, put them on. ? Use  tweezers, curved forceps, or a tick-removal tool to grasp the tick. Grasp the tick as close to your skin and as close to the tick's head as possible. ? Gently pull up until the tick lets go.  Try to keep the tick's head attached to its body.  Do not twist or jerk the tick.  Do not squeeze or crush the tick.  Do not try to remove a tick with heat, alcohol, petroleum jelly, or fingernail polish. How should I get rid of a tick? Here are some ways to get rid of a tick that is alive:  Place the tick in rubbing alcohol.  Place the tick in a bag or container you can close tightly.  Wrap the tick tightly in tape.  Flush the tick down the toilet.  Contact a doctor if:  You have symptoms of a disease, such as: ? Pain in a muscle, joint, or bone. ? Trouble walking or moving your legs. ? Numbness in your legs. ? Inability to move (paralysis). ? A red rash that makes a circle (bull's-eye rash). ? Redness and swelling where the tick bit you. ? A fever. ? Throwing up (vomiting) over and over. ? Diarrhea. ? Weight loss. ? Tender and swollen lymph glands. ? Shortness of breath. ? Cough. ? Belly  pain (abdominal pain). ? Headache. ? Being more tired than normal. ? A change in how alert (conscious) you are. ? Confusion. Get help right away if:  You cannot remove a tick.  A part of a tick breaks off and gets stuck in your skin.  You are feeling worse. Summary  Ticks may carry germs that can make you sick.  To prevent tick bites, wear long sleeves, long pants, and light colors. Use insect repellent. Follow the instructions on the bottle.  If the tick is biting, do not try to remove it with heat, alcohol, petroleum jelly, or fingernail polish.  Use tweezers, curved forceps, or a tick-removal tool to grasp the tick. Gently pull up until the tick lets go. Do not twist or jerk the tick. Do not squeeze or crush the tick.  If you have symptoms, contact a doctor. This information is  not intended to replace advice given to you by your health care provider. Make sure you discuss any questions you have with your health care provider. Document Released: 10/04/2009 Document Revised: 10/20/2016 Document Reviewed: 10/20/2016 Elsevier Interactive Patient Education  2018 [employer].

## 2017-12-10 NOTE — Assessment & Plan Note (Signed)
Stable-improved on amlodipine 5 daily Continue current medication RTC in 3 months for F/U, or sooner for readings >140/90

## 2018-03-11 ENCOUNTER — Other Ambulatory Visit (INDEPENDENT_AMBULATORY_CARE_PROVIDER_SITE_OTHER): Payer: 59

## 2018-03-11 ENCOUNTER — Encounter: Payer: Self-pay | Admitting: Nurse Practitioner

## 2018-03-11 ENCOUNTER — Ambulatory Visit (INDEPENDENT_AMBULATORY_CARE_PROVIDER_SITE_OTHER): Payer: 59 | Admitting: Nurse Practitioner

## 2018-03-11 VITALS — BP 110/86 | HR 86 | Ht 70.0 in | Wt 267.0 lb

## 2018-03-11 DIAGNOSIS — Z114 Encounter for screening for human immunodeficiency virus [HIV]: Secondary | ICD-10-CM

## 2018-03-11 DIAGNOSIS — Z13220 Encounter for screening for lipoid disorders: Secondary | ICD-10-CM

## 2018-03-11 DIAGNOSIS — I10 Essential (primary) hypertension: Secondary | ICD-10-CM | POA: Diagnosis not present

## 2018-03-11 DIAGNOSIS — Z0000 Encounter for general adult medical examination without abnormal findings: Secondary | ICD-10-CM

## 2018-03-11 DIAGNOSIS — T796XXS Traumatic ischemia of muscle, sequela: Secondary | ICD-10-CM

## 2018-03-11 DIAGNOSIS — Z0001 Encounter for general adult medical examination with abnormal findings: Secondary | ICD-10-CM | POA: Diagnosis not present

## 2018-03-11 LAB — COMPREHENSIVE METABOLIC PANEL
ALBUMIN: 4.5 g/dL (ref 3.5–5.2)
ALK PHOS: 58 U/L (ref 39–117)
ALT: 30 U/L (ref 0–53)
AST: 39 U/L — ABNORMAL HIGH (ref 0–37)
BUN: 11 mg/dL (ref 6–23)
CO2: 28 mEq/L (ref 19–32)
CREATININE: 1.35 mg/dL (ref 0.40–1.50)
Calcium: 9.9 mg/dL (ref 8.4–10.5)
Chloride: 101 mEq/L (ref 96–112)
GFR: 76.23 mL/min (ref >60.00)
Glucose, Bld: 91 mg/dL (ref 70–99)
Potassium: 4.1 mEq/L (ref 3.5–5.1)
SODIUM: 137 meq/L (ref 135–145)
TOTAL PROTEIN: 7.5 g/dL (ref 6.0–8.3)
Total Bilirubin: 1 mg/dL (ref 0.2–1.2)

## 2018-03-11 LAB — LIPID PANEL
CHOLESTEROL: 186 mg/dL (ref 0–200)
HDL: 43.5 mg/dL (ref >39.00)
LDL Cholesterol: 113 mg/dL — ABNORMAL HIGH (ref 0–99)
NonHDL: 142.34
Total CHOL/HDL Ratio: 4
Triglycerides: 149 mg/dL (ref 0.0–149.0)
VLDL: 29.8 mg/dL (ref 0.0–40.0)

## 2018-03-11 LAB — CBC
HEMATOCRIT: 46.8 % (ref 39.0–52.0)
Hemoglobin: 15.3 g/dL (ref 13.0–17.0)
MCHC: 32.6 g/dL (ref 30.0–36.0)
MCV: 85.2 fl (ref 78.0–100.0)
Platelets: 228 10*3/uL (ref 150.0–400.0)
RBC: 5.5 Mil/uL (ref 4.22–5.81)
RDW: 14.2 % (ref 11.5–15.5)
WBC: 7.2 10*3/uL (ref 4.0–10.5)

## 2018-03-11 NOTE — Patient Instructions (Addendum)
Please head downstairs for lab work. If any of your test results are critically abnormal, you will be contacted right away. Otherwise, I will contact you within a week about your test results and follow up recommendations  Please return for a nurse visit in about 1 month to have your blood pressure rechecked.  [facility] call [facility] follow [facility]: [facility] [address] [place] [id number] [phone]  Please return to see me in about 6 months for follow up visit   Health Maintenance, Male A healthy lifestyle and preventive care is important for your health and wellness. Ask your health care provider about what schedule of regular examinations is right for you. What should I know about weight and diet? Eat a Healthy Diet  Eat plenty of vegetables, fruits, whole grains, low-fat dairy products, and lean protein.  Do not eat a lot of foods high in solid fats, added sugars, or salt.  Maintain a Healthy Weight Regular exercise can help you achieve or maintain a healthy weight. You should:  Do at least 150 minutes of exercise each week. The exercise should increase your heart rate and make you sweat (moderate-intensity exercise).  Do strength-training exercises at least twice a week.  Watch Your Levels of Cholesterol and Blood Lipids  Have your blood tested for lipids and cholesterol every 5 years starting at 37 years of age. If you are at high risk for heart disease, you should start having your blood tested when you are 37 years old. You may need to have your cholesterol levels checked more often if: ? Your lipid or cholesterol levels are high. ? You are older than 37 years of age. ? You are at high risk for heart disease.  What should I know about cancer screening? Many types of cancers can be detected early and may often be prevented. Lung Cancer  You should be screened every year for lung cancer if: ? You are a current smoker who has smoked for at  least 30 years. ? You are a former smoker who has quit within the past 15 years.  Talk to your health care provider about your screening options, when you should start screening, and how often you should be screened.  Colorectal Cancer  Routine colorectal cancer screening usually begins at 37 years of age and should be repeated every 5-10 years until you are 37 years old. You may need to be screened more often if early forms of precancerous polyps or small growths are found. Your health care provider may recommend screening at an earlier age if you have risk factors for colon cancer.  Your health care provider may recommend using home test kits to check for hidden blood in the stool.  A small camera at the end of a tube can be used to examine your colon (sigmoidoscopy or colonoscopy). This checks for the earliest forms of colorectal cancer.  Prostate and Testicular Cancer  Depending on your age and overall health, your health care provider may do certain tests to screen for prostate and testicular cancer.  Talk to your health care provider about any symptoms or concerns you have about testicular or prostate cancer.  Skin Cancer  Check your skin from head to toe regularly.  Tell your health care provider about any new moles or changes in moles, especially if: ? There is a change in a mole's size, shape, or color. ? You have a mole that is larger than a pencil eraser.  Always  use sunscreen. Apply sunscreen liberally and repeat throughout the day.  Protect yourself by wearing long sleeves, pants, a wide-brimmed hat, and sunglasses when outside.  What should I know about heart disease, diabetes, and high blood pressure?  If you are 4218-37 years of age, have your blood pressure checked every 3-5 years. If you are 37 years of age or older, have your blood pressure checked every year. You should have your blood pressure measured twice-once when you are at a hospital or clinic, and once when  you are not at a hospital or clinic. Record the average of the two measurements. To check your blood pressure when you are not at a hospital or clinic, you can use: ? An automated blood pressure machine at a pharmacy. ? A home blood pressure monitor.  Talk to your health care provider about your target blood pressure.  If you are between 3745-37 years old, ask your health care provider if you should take aspirin to prevent heart disease.  Have regular diabetes screenings by checking your fasting blood sugar level. ? If you are at a normal weight and have a low risk for diabetes, have this test once every three years after the age of 37. ? If you are overweight and have a high risk for diabetes, consider being tested at a younger age or more often.  A one-time screening for abdominal aortic aneurysm (AAA) by ultrasound is recommended for men aged 65-75 years who are current or former smokers. What should I know about preventing infection? Hepatitis B If you have a higher risk for hepatitis B, you should be screened for this virus. Talk with your health care provider to find out if you are at risk for hepatitis B infection. Hepatitis C Blood testing is recommended for:  Everyone born from [birth year] through 1965.  Anyone with known risk factors for hepatitis C.  Sexually Transmitted Diseases (STDs)  You should be screened each year for STDs including gonorrhea and chlamydia if: ? You are sexually active and are younger than 37 years of age. ? You are older than 37 years of age and your health care provider tells you that you are at risk for this type of infection. ? Your sexual activity has changed since you were last screened and you are at an increased risk for chlamydia or gonorrhea. Ask your health care provider if you are at risk.  Talk with your health care provider about whether you are at high risk of being infected with HIV. Your health care provider may recommend a prescription medicine  to help prevent HIV infection.  What else can I do?  Schedule regular health, dental, and eye exams.  Stay current with your vaccines (immunizations).  Do not use any tobacco products, such as cigarettes, chewing tobacco, and e-cigarettes. If you need help quitting, ask your health care provider.  Limit alcohol intake to no more than 2 drinks per day. One drink equals 12 ounces of beer, 5 ounces of wine, or 1 ounces of hard liquor.  Do not use street drugs.  Do not share needles.  Ask your health care provider for help if you need support or information about quitting drugs.  Tell your health care provider if you often feel depressed.  Tell your health care provider if you have ever been abused or do not feel safe at home. This information is not intended to replace advice given to you by your health care provider. Make sure you discuss any questions  you have with your health care provider. Document Released: 01/06/2008 Document Revised: 03/08/2016 Document Reviewed: 04/13/2015 Elsevier Interactive Patient Education  [employer].

## 2018-03-11 NOTE — Assessment & Plan Note (Signed)
-  USPSTF grade A and B recommendations reviewed with patient; age-appropriate recommendations, preventive care, screening tests, etc discussed and encouraged; healthy living and sunscreen use encouraged; see AVS for patient education given to patient. Advanced directives packet provided -Discussed importance of healthy diet, regular physical activity, drink plenty of water and avoid sweet beverages.  -Red flags and when to present for emergency care or RTC including fever >101.34F, chest pain, shortness of breath, new/worsening/un-resolving symptoms,  reviewed with patient at time of visit. Follow up and care instructions discussed and provided in AVS.  -Reviewed Health Maintenance: HIV screening ordered today, otherwise up to date  General medical examination - CBC; Future - Comprehensive metabolic panel; Future - Lipid panel; Future-Screening for cholesterol level- he is fasting - HIV antibody; Future-  Screening for HIV (human immunodeficiency virus)

## 2018-03-11 NOTE — Assessment & Plan Note (Addendum)
BP appears stable off amlodipine, will hold for now RTC in 1 month for nurse visit to recheck BP RTC in 6 months for BP Follow up visit Continue to monitor BP readings at home, F/U sooner for readings >140/90 - CBC; Future - Comprehensive metabolic panel; Future

## 2018-03-11 NOTE — Assessment & Plan Note (Addendum)
Per referral notes it looks like he was scheduled for an appointment in July that he missed Recommended he continue with follow up and I have Provided number to call for follow up on his AVS today

## 2018-03-11 NOTE — Progress Notes (Signed)
Name: Benjamin Warner   MRN: [id number]    DOB: 1981/04/14   Date:03/11/2018       Progress Note  Subjective  Chief Complaint CPE  HPI  Patient presents for annual CPE. I actually referred him to rheumatology for repeated evaluation of exercise rhabdomyolysis back in March but he tells me today he does not think he was called for the appointment, changed his phone number. He has continued to notice some muscle aches after working out  USPSTF grade A and B recommendations:  Diet, Exercise: regularly exercises, trying to watch his diet  Depression: no concerns Depression screen [facility] 2/9 10/08/2017 01/01/2014  Decreased Interest 0 0  Down, Depressed, Hopeless 0 0  PHQ - 2 Score 0 0  Altered sleeping 3 -  Tired, decreased energy 2 -  Change in appetite 0 -  Feeling bad or failure about yourself  0 -  Trouble concentrating 0 -  Moving slowly or fidgety/restless 0 -  Suicidal thoughts 0 -  PHQ-9 Score 5 -   Hypertension: maintained on amlodipine 5 daily, reports he has not consistently been taking the amlodipine, missing several weeks at a time, missed all last week, has noted his BP has been normal off the medication BP Readings from Last 3 Encounters:  03/11/18 110/86  12/10/17 118/84  12/03/17 128/80   Obesity: Wt Readings from Last 3 Encounters:  03/11/18 267 lb (121.1 kg)  12/10/17 276 lb 12.8 oz (125.6 kg)  12/03/17 254 lb (115.2 kg)   BMI Readings from Last 3 Encounters:  03/11/18 38.31 kg/m  12/10/17 39.72 kg/m  12/03/17 36.45 kg/m   Lipids: update lipid panel today Lab Results  Component Value Date   CHOL 178 01/01/2014   CHOL 200 11/10/2011   CHOL 156 09/19/2010   Lab Results  Component Value Date   HDL 45.40 01/01/2014   HDL 45.30 11/10/2011   HDL 38.70 (L) 09/19/2010   Lab Results  Component Value Date   LDLCALC 100 (H) 01/01/2014   LDLCALC 84 09/19/2010   LDLCALC 99 08/23/2009   Lab Results  Component Value Date   TRIG 161.0 (H) 01/01/2014    TRIG 235.0 (H) 11/10/2011   TRIG 169.0 (H) 09/19/2010   Lab Results  Component Value Date   CHOLHDL 4 01/01/2014   CHOLHDL 4 11/10/2011   CHOLHDL 4 09/19/2010   Lab Results  Component Value Date   LDLDIRECT 94.1 11/10/2011   Glucose:  Glucose, Bld  Date Value Ref Range Status  10/08/2017 87 70 - 99 mg/dL Final  40/98/1191 90 70 - 99 mg/dL Final  47/82/9562 80 70 - 99 mg/dL Final    Alcohol:social drinker, some alcohol on the weekends Tobacco use: no, never  STD testing and prevention (chl/gon/syphilis): no concerns, declines HIV: HIV screening ordered today  Skin cancer: does not routinely wear sunscreen  ECG:  Not indicated  Vaccinations: up to date  Advanced Care Planning: A voluntary discussion about advance care planning including the explanation and discussion of advance directives.  Discussed health care proxy and Living will, and the patient DOES NOT have a living will at present time. If patient does have living will, I have requested they bring this to the clinic to be scanned in to their chart.  Patient Active Problem List   Diagnosis Date Noted  . 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 Rhabdomyolysis 01/03/2013  . GERD (gastroesophageal reflux disease) 06/24/2012  . CTS (carpal tunnel syndrome) 02/08/2012  . General medical examination 11/10/2011  .  HTN (hypertension) 11/10/2011  . Elevated CK   . Snoring 03/11/2010  . HEMORRHOIDS, INTERNAL W/O COMPLICATION 01/14/2007  . LIVER FUNCTION TESTS, ABNORMAL 10/29/2006    Past Surgical History:  Procedure Laterality Date  . CYST EXCISION     2012---Excision of right upper back and left lower back epidermoid    . MUSCLE BIOPSY    . WISDOM TOOTH EXTRACTION      Family History  Problem Relation Age of Onset  . Hypertension Mother   . Heart attack Father [id number]  . Hypertension Father   . Hyperlipidemia Neg Hx   . Diabetes Neg Hx   . Sudden death Neg Hx     Social History   Socioeconomic History  . Marital  status: Married    Spouse name: Not on file  . Number of children: 1  . Years of education: Not on file  . Highest education level: Not on file  Occupational History  . Occupation: truck [employer]     Employer: [employer]  Social Needs  . Financial resource strain: Not on file  . Food insecurity:    Worry: Not on file    Inability: Not on file  . Transportation needs:    Medical: Not on file    Non-medical: Not on file  Tobacco Use  . Smoking status: Former Games developermoker  . Smokeless tobacco: Never Used  Substance and Sexual Activity  . Alcohol use: Yes    Alcohol/week: 0.0 standard drinks    Comment: WEEKENDS  . Drug use: No    Types: Ketamine, MDMA (Ecstacy)    Comment: 10 years ago  . Sexual activity: Not on file  Lifestyle  . Physical activity:    Days per week: Not on file    Minutes per session: Not on file  . Stress: Not on file  Relationships  . Social connections:    Talks on phone: Not on file    Gets together: Not on file    Attends religious service: Not on file    Active member of club or organization: Not on file    Attends meetings of clubs or organizations: Not on file    Relationship status: Not on file  . Intimate partner violence:    Fear of current or ex partner: Not on file    Emotionally abused: Not on file    Physically abused: Not on file    Forced sexual activity: Not on file  Other Topics Concern  . Not on file  Social History Narrative   Tobacco -- smoked "socially" as a teenage x 2-3 years.  Nonsmoker at present         Current Outpatient Medications:  .  amLODipine (NORVASC) 5 MG tablet, Take 1 tablet (5 mg total) by mouth daily., Disp: 90 tablet, Rfl: 1  No Known Allergies   ROS  Constitutional: Negative for fever or weight change.  Respiratory: Negative for cough and shortness of breath.   Cardiovascular: Negative for chest pain or palpitations.  Gastrointestinal: Negative for abdominal pain, no bowel changes.   Musculoskeletal: Negative for gait problem or joint swelling.  Skin: Negative for rash.  Neurological: Negative for dizziness or headache.  No other specific complaints in a complete review of systems (except as listed in HPI above).   Objective  Vitals:   03/11/18 1316  BP: 110/86  Pulse: 86  SpO2: 97%  Weight: 267 lb (121.1 kg)  Height: 5\' 10"  (1.778 m)    Body mass index  is 38.31 kg/m.  Physical Exam Vital signs reviewed. Constitutional: Patient appears well-developed and well-nourished. No distress.  HENT: Head: Normocephalic and atraumatic. Ears: B TMs ok, no erythema or effusion; Nose: Nose normal. Mouth/Throat: Oropharynx is clear and moist. No oropharyngeal exudate.  Eyes: Conjunctivae and EOM are normal. Pupils are equal, round, and reactive to light. No scleral icterus.  Neck: Normal range of motion. Neck supple. No cervical adenopathy. No thyromegaly present.  Cardiovascular: Normal rate, regular rhythm and normal heart sounds.  No murmur heard. No BLE edema. Distal pulses intact Pulmonary/Chest: Effort normal and breath sounds normal. No respiratory distress. Abdominal: Soft. Bowel sounds are normal, no distension. There is no tenderness. no masses Musculoskeletal: Normal range of motion, No gross deformities Neurological: he is alert and oriented to person, place, and time. No cranial nerve deficit. Coordination, balance, strength, speech and gait are normal.  Skin: Skin is warm and dry. No rash noted. No erythema.  Psychiatric: Patient has a normal mood and affect. behavior is normal. Judgment and thought content normal.  Assessment & Plan RTC in 6 months for F/U: HTN- recheck BP

## 2018-03-12 LAB — HIV ANTIBODY (ROUTINE TESTING W REFLEX): HIV 1&2 Ab, 4th Generation: NONREACTIVE

## 2018-03-18 ENCOUNTER — Other Ambulatory Visit: Payer: Self-pay | Admitting: Nurse Practitioner

## 2018-03-18 DIAGNOSIS — R945 Abnormal results of liver function studies: Secondary | ICD-10-CM

## 2018-04-01 ENCOUNTER — Ambulatory Visit
Admission: RE | Admit: 2018-04-01 | Discharge: 2018-04-01 | Disposition: A | Discharge status: Home / Self Care | Payer: 59 | Source: Ambulatory Visit | Attending: Nurse Practitioner | Admitting: Nurse Practitioner

## 2018-04-01 ENCOUNTER — Telehealth: Payer: Self-pay

## 2018-04-01 DIAGNOSIS — K7689 Other specified diseases of liver: Secondary | ICD-10-CM | POA: Diagnosis not present

## 2018-04-01 DIAGNOSIS — R945 Abnormal results of liver function studies: Secondary | ICD-10-CM

## 2018-04-01 NOTE — Telephone Encounter (Signed)
Patient advised of ashleigh's note/instructions, patient repeated back for understanding

## 2018-04-01 NOTE — Addendum Note (Signed)
Addended by: Evaristo Bury on: 04/01/2018 12:18 PM   Modules accepted: Orders

## 2018-04-01 NOTE — Telephone Encounter (Signed)
Patient came in today for bp recheck---reading today is 132/82---patient would like to continue watching his diet (trying to keep sodium low) and remain off bp medication----please advise if you are ok with continuing to monitor bp, how often he should come in for bp checks, or if he needs to continue meds---I will call patient back, thanks

## 2018-04-01 NOTE — Telephone Encounter (Signed)
We will stop his amlodipine for now, I have removed from his medication list I will plan to see him back around January for a routine follow up However, I would like him to continue monitoring his BP readings at home, please return for follow up sooner if he starts to see readings >140/90. thx!

## 2018-04-05 ENCOUNTER — Encounter: Payer: Self-pay | Admitting: Nurse Practitioner

## 2018-07-08 ENCOUNTER — Encounter: Payer: Self-pay | Admitting: Nurse Practitioner

## 2018-08-05 ENCOUNTER — Ambulatory Visit: Payer: 59 | Admitting: Nurse Practitioner

## 2018-08-05 DIAGNOSIS — Z0289 Encounter for other administrative examinations: Secondary | ICD-10-CM

## 2018-11-05 ENCOUNTER — Ambulatory Visit (INDEPENDENT_AMBULATORY_CARE_PROVIDER_SITE_OTHER): Payer: 59 | Admitting: Internal Medicine

## 2018-11-05 ENCOUNTER — Encounter: Payer: Self-pay | Admitting: Internal Medicine

## 2018-11-05 DIAGNOSIS — K219 Gastro-esophageal reflux disease without esophagitis: Secondary | ICD-10-CM

## 2018-11-05 MED ORDER — PANTOPRAZOLE SODIUM 40 MG PO TBEC: 40.0000 mg | DELAYED_RELEASE_TABLET | Freq: Every day | ORAL | 0 refills | Status: DC

## 2018-11-05 NOTE — Progress Notes (Signed)
Virtual Visit via Video Note  I connected with Benjamin Warner on 11/05/18 at  4:00 PM EDT by a video enabled telemedicine application and verified that I am speaking with the correct person using two identifiers.   I discussed the limitations of evaluation and management by telemedicine and the availability of in person appointments. The patient expressed understanding and agreed to proceed.  History of Present Illness: The patient is a 38 y.o. man with visit for heartburn symptoms. Started about 1-2 weeks ago. Has mild cough, denies SOB or fevers or chills. Denies change in symptoms with eating certain foods, mostly after eating. Denies weight loss or nausea or vomiting. Denies diarrhea or abdominal pain. Denies food getting stuck. Has had GERD in the past which felt some similar. Denies significant night time symptoms. Overall it is worsening. Has tried nothing  Observations/Objective: Appearance: normal, breathing appears normal, casual grooming, abdomen does not appear distended, throat normal, mental status is A and O times 3  Assessment and Plan: See problem oriented charting  Follow Up Instructions: Rx for protonix for 2 weeks then try cessation  I discussed the assessment and treatment plan with the patient. The patient was provided an opportunity to ask questions and all were answered. The patient agreed with the plan and demonstrated an understanding of the instructions.   The patient was advised to call back or seek an in-person evaluation if the symptoms worsen or if the condition fails to improve as anticipated.  Myrlene Broker, MD

## 2018-11-06 ENCOUNTER — Encounter: Payer: Self-pay | Admitting: Internal Medicine

## 2018-11-06 NOTE — Assessment & Plan Note (Signed)
Sounds to be GERD, likely some PND exacerbating this. Rx for pantoprazole for 1 month. Advised to try for 2 weeks and then try stopping. Offered zyrtec as well for post nasal drip.

## 2018-11-08 ENCOUNTER — Telehealth: Payer: Self-pay | Admitting: Family

## 2018-11-08 NOTE — Telephone Encounter (Signed)
Pharmacy sent note requesting Amlodipine 5 mg. He just saw Crawford on 11/05/18 and this is not on his medication list. Does he still take this or did pharmacy send?

## 2018-12-02 ENCOUNTER — Other Ambulatory Visit: Payer: Self-pay | Admitting: Internal Medicine

## 2020-03-01 ENCOUNTER — Telehealth: Payer: Self-pay | Admitting: Pulmonary Disease

## 2020-03-01 NOTE — Telephone Encounter (Signed)
ATC, no VM set up.

## 2020-03-02 NOTE — Telephone Encounter (Signed)
Pt needs to be scheduled for an appointment as we have not seen him since 2019. ATC pt, there was no answer and his VM is not set up. Will try back.

## 2020-03-03 NOTE — Telephone Encounter (Signed)
Spoke with the pt and advised needs ov since not seen since 2019- appt scheduled with BW 03/12/20.

## 2020-03-12 ENCOUNTER — Ambulatory Visit: Payer: 59 | Admitting: Primary Care

## 2020-03-12 NOTE — Progress Notes (Deleted)
'@Patient'  ID: Benjamin Warner, male    DOB: May 15, 1981, 39 y.o.   MRN: [id number]  No chief complaint on file.   Referring provider: Lance Sell, NP  HPI: 39 year old male. PMH significant for snoring. Patient of Dr. Elsworth Soho, seen for initial consult on 12/04/19. Ordered for HST. For snoring recommended nasal decongestant/steriods with oral appliance.   Previous [facility] pulmonary encounter 12/04/19- Dr. 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 He underwent a sleep study 03/2010 which showed total sleep time 360 minutes an AHI of 2.8/hour, events were not enough to warrant therapy. He is separated now in no bed partner history is available.  He was noted to be hypertensive by his PCP and amlodipine was started.  Prior to that he was having difficulty getting to sleep and was having frequent nocturnal awakenings.  There were times he had woken himself up by his own snoring.  After starting on amlodipine, his blood pressure is much improved and so is his sleep.  Sleep latency has decreased to 30 minutes to an hour. He denies excessive daytime somnolence or fatigue, Epworth sleepiness score is 2. Bedtime is between 10 PM and midnight, sleeps on his stomach sometimes without any pillows, reports 2-3 nocturnal awakenings including nocturia and is out of bed at 7 AM feeling rested without dryness of mouth or headaches. There is no history suggestive of cataplexy, sleep paralysis or parasomnias  He tried an oral appliance which she got 2 years ago but his snoring persisted which is why he thinks that snoring must be coming from his nose rather than his throat  He has a history of rhabdomyolysis, underwent muscle biopsy in the past without any clear diagnosis, I note that CK was in the 4000 range 09/2017.  He has just started working out again and has lost about 10 pounds in the gym.  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  He has not had problems getting his DOT physical   03/12/2020- Interim hx Patient presents today for DOT physical for sleep apnea.       No Known Allergies  Immunization History  Administered Date(s) Administered   DTaP 09/26/1980, 12/16/1980, 03/02/1981, 03/01/1982, 03/13/1986   IPV 09/26/1980, 12/16/1980, 12/02/1981, 03/01/1982, 03/13/1986   MMR 12/02/1981   Td 03/24/1996, 10/29/2006   Tdap 12/10/2017    Past Medical History:  Diagnosis Date   Elevated CK    chronic, ~ 500, saw neuro, muscle Bx 12-2010: negative    GERD (gastroesophageal reflux disease)    Hypertension approx 2005   Rhabdomyolysis 8/03   ck=20,000-unclear etiology   Rhabdomyolysis    Snoring    sleep study 03-2010----> negative for sleep apnea, Rx wt loss      Tobacco History: Social History   Tobacco Use  Smoking Status Former Smoker  Smokeless Tobacco Never Used   Counseling given: Not Answered   Outpatient Medications Prior to Visit  Medication Sig Dispense Refill   pantoprazole (PROTONIX) 40 MG tablet TAKE 1 TABLET(40 MG) BY MOUTH DAILY 30 tablet 0   No facility-administered medications prior to visit.      Review of Systems  Review of Systems   Physical Exam  There were no vitals taken for this visit. Physical Exam   Lab Results:  CBC    Component Value Date/Time   WBC 7.2 03/11/2018  1358   RBC 5.50 03/11/2018 1358   HGB 15.3 03/11/2018 1358   HCT 46.8 03/11/2018 1358   PLT 228.0 03/11/2018 1358   MCV 85.2 03/11/2018 1358   MCH 28.1 01/04/2013 0410   MCHC 32.6 03/11/2018 1358   RDW 14.2 03/11/2018 1358   LYMPHSABS 3.1 01/01/2014 1000   MONOABS 0.6 01/01/2014 1000   EOSABS 0.1 01/01/2014 1000   BASOSABS 0.0 01/01/2014 1000    BMET    Component Value Date/Time   NA 137 03/11/2018 1358   K 4.1 03/11/2018 1358   CL 101 03/11/2018 1358   CO2 28 03/11/2018 1358    GLUCOSE 91 03/11/2018 1358   BUN 11 03/11/2018 1358   CREATININE 1.35 03/11/2018 1358   CALCIUM 9.9 03/11/2018 1358   GFRNONAA 85 (L) 01/07/2013 0510   GFRAA >90 01/07/2013 0510    BNP No results found for: BNP  ProBNP No results found for: PROBNP  Imaging: No results found.   Assessment & Plan:   No problem-specific Assessment & Plan notes found for this encounter.     Martyn Ehrich, NP 03/12/2020

## 2020-04-08 ENCOUNTER — Emergency Department (HOSPITAL_BASED_OUTPATIENT_CLINIC_OR_DEPARTMENT_OTHER): Payer: 59

## 2020-04-08 ENCOUNTER — Emergency Department (HOSPITAL_BASED_OUTPATIENT_CLINIC_OR_DEPARTMENT_OTHER)
Admission: EM | Admit: 2020-04-08 | Discharge: 2020-04-08 | Disposition: A | Discharge status: Home / Self Care | Payer: 59 | Attending: Emergency Medicine | Admitting: Emergency Medicine

## 2020-04-08 ENCOUNTER — Encounter (HOSPITAL_BASED_OUTPATIENT_CLINIC_OR_DEPARTMENT_OTHER): Payer: Self-pay | Admitting: Emergency Medicine

## 2020-04-08 ENCOUNTER — Other Ambulatory Visit: Payer: Self-pay

## 2020-04-08 DIAGNOSIS — I10 Essential (primary) hypertension: Secondary | ICD-10-CM | POA: Insufficient documentation

## 2020-04-08 DIAGNOSIS — U071 COVID-19: Secondary | ICD-10-CM

## 2020-04-08 DIAGNOSIS — R0789 Other chest pain: Secondary | ICD-10-CM

## 2020-04-08 DIAGNOSIS — Z87891 Personal history of nicotine dependence: Secondary | ICD-10-CM | POA: Diagnosis not present

## 2020-04-08 LAB — CBC WITH DIFFERENTIAL/PLATELET
Abs Immature Granulocytes: 0.02 10*3/uL (ref 0.00–0.07)
Basophils Absolute: 0 10*3/uL (ref 0.0–0.1)
Basophils Relative: 0 %
Eosinophils Absolute: 0 10*3/uL (ref 0.0–0.5)
Eosinophils Relative: 0 %
HCT: 47.8 % (ref 39.0–52.0)
Hemoglobin: 15.5 g/dL (ref 13.0–17.0)
Immature Granulocytes: 0 %
Lymphocytes Relative: 21 %
Lymphs Abs: 1.6 10*3/uL (ref 0.7–4.0)
MCH: 27.8 pg (ref 26.0–34.0)
MCHC: 32.4 g/dL (ref 30.0–36.0)
MCV: 85.7 fL (ref 80.0–100.0)
Monocytes Absolute: 0.8 10*3/uL (ref 0.1–1.0)
Monocytes Relative: 10 %
Neutro Abs: 5.2 10*3/uL (ref 1.7–7.7)
Neutrophils Relative %: 69 %
Platelets: 210 10*3/uL (ref 150–400)
RBC: 5.58 MIL/uL (ref 4.22–5.81)
RDW: 14.1 % (ref 11.5–15.5)
WBC: 7.6 10*3/uL (ref 4.0–10.5)
nRBC: 0 % (ref 0.0–0.2)

## 2020-04-08 LAB — COMPREHENSIVE METABOLIC PANEL
ALT: 49 U/L — ABNORMAL HIGH (ref 0–44)
AST: 52 U/L — ABNORMAL HIGH (ref 15–41)
Albumin: 4.3 g/dL (ref 3.5–5.0)
Alkaline Phosphatase: 47 U/L (ref 38–126)
Anion gap: 11 (ref 5–15)
BUN: 12 mg/dL (ref 6–20)
CO2: 26 mmol/L (ref 22–32)
Calcium: 8.5 mg/dL — ABNORMAL LOW (ref 8.9–10.3)
Chloride: 99 mmol/L (ref 98–111)
Creatinine, Ser: 1.13 mg/dL (ref 0.61–1.24)
GFR calc Af Amer: >60 mL/min (ref >60)
GFR calc non Af Amer: >60 mL/min (ref >60)
Glucose, Bld: 106 mg/dL — ABNORMAL HIGH (ref 70–99)
Potassium: 4.2 mmol/L (ref 3.5–5.1)
Sodium: 136 mmol/L (ref 135–145)
Total Bilirubin: 0.5 mg/dL (ref 0.3–1.2)
Total Protein: 8 g/dL (ref 6.5–8.1)

## 2020-04-08 LAB — D-DIMER, QUANTITATIVE: D-Dimer, Quant: <0.27 ug/mL-FEU (ref 0.00–0.50)

## 2020-04-08 LAB — TROPONIN I (HIGH SENSITIVITY): Troponin I (High Sensitivity): 5 ng/L (ref <18)

## 2020-04-08 NOTE — ED Notes (Signed)
Ambulated from lobby to room 11.  SpO2 98-99%, HR 98-105, denies +DOE.

## 2020-04-08 NOTE — ED Triage Notes (Signed)
Dx with COVID 9/14. C/o sharp L side chest pain since last night.

## 2020-04-08 NOTE — ED Provider Notes (Signed)
[facility] EMERGENCY DEPARTMENT Provider Note   CSN: [id number] Arrival date & time: 04/08/20  3016     History Chief Complaint  Patient presents with  . Chest Pain    COVID+    Benjamin Warner is a 39 y.o. male.  The history is provided by the patient and medical records. No language interpreter was used.  Chest Pain  Benjamin Warner is a 39 y.o. male who presents to the Emergency Department complaining of chest pain. He presents the emergency department complaining of sharp left-sided chest pain that began yesterday. He began getting ill on Sunday and tested positive for COVID-19 on Tuesday of this week. Initially his symptoms were headaches, body aches, cough, loss of taste and smell. Yesterday he developed sharp and intermittent left sided and axillary chest pain. Episodes last one to five seconds at a time. There are no clear alleviating or worsening factors. He has occasional shortness of breath. No history of DVT/PE. He has not been vaccinated for COVID-19. Symptoms are moderate, waxing and waning, worsening.     Past Medical History:  Diagnosis Date  . Elevated CK    chronic, ~ 500, saw neuro, muscle Bx 12-2010: negative   . GERD (gastroesophageal reflux disease)   . Hypertension approx 2005  . Rhabdomyolysis 8/03   ck=20,000-unclear etiology  . Rhabdomyolysis   . Snoring    sleep study 03-2010----> negative for sleep apnea, Rx wt loss      Patient Active Problem List   Diagnosis Date Noted  . 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 Rhabdomyolysis 01/03/2013  . GERD (gastroesophageal reflux disease) 06/24/2012  . CTS (carpal tunnel syndrome) 02/08/2012  . General medical examination 11/10/2011  . HTN (hypertension) 11/10/2011  . Elevated CK   . Snoring 03/11/2010  . HEMORRHOIDS, INTERNAL W/O COMPLICATION 01/14/2007  . LIVER FUNCTION TESTS, ABNORMAL 10/29/2006    Past Surgical History:  Procedure Laterality Date  . CYST EXCISION     20 12---Excision of  right upper back and left lower back epidermoid    . MUSCLE BIOPSY    . WISDOM TOOTH EXTRACTION         Family History  Problem Relation Age of Onset  . Hypertension Mother   . Heart attack Father 65  . Hypertension Father   . Hyperlipidemia Neg Hx   . Diabetes Neg Hx   . Sudden death Neg Hx     Social History   Tobacco Use  . Smoking status: Former 57  . Smokeless tobacco: Never Used  Substance Use Topics  . Alcohol use: Yes    Alcohol/week: 0.0 standard drinks    Comment: WEEKENDS  . Drug use: No    Types: Ketamine, MDMA (Ecstacy)    Comment: 10 years ago    Home Medications Prior to Admission medications   Medication Sig Start Date End Date Taking? Authorizing Provider  pantoprazole (PROTONIX) 40 MG tablet TAKE 1 TABLET(40 MG) BY MOUTH DAILY 12/02/18   02/01/19, MD    Allergies    Patient has no known allergies.  Review of Systems   Review of Systems  Cardiovascular: Positive for chest pain.  All other systems reviewed and are negative.   Physical Exam Updated Vital Signs BP (!) 136/91   Pulse 79   Temp 98.5 F (36.9 C) (Oral)   Resp (!) 25   Ht 5\' 10"  (1.778 m)   Wt 120.7 kg   SpO2 98%   BMI 38.17 kg/m   Physical  Exam Vitals and nursing note reviewed.  Constitutional:      Appearance: He is well-developed.  HENT:     Head: Normocephalic and atraumatic.  Cardiovascular:     Rate and Rhythm: Normal rate and regular rhythm.     Heart sounds: No murmur heard.   Pulmonary:     Effort: Pulmonary effort is normal. No respiratory distress.     Breath sounds: Normal breath sounds.  Chest:     Chest wall: No tenderness.  Abdominal:     Palpations: Abdomen is soft.     Tenderness: There is no abdominal tenderness. There is no guarding or rebound.  Musculoskeletal:        General: No swelling or tenderness.  Skin:    General: Skin is warm and dry.  Neurological:     Mental Status: He is alert and oriented to person, place, and  time.  Psychiatric:        Behavior: Behavior normal.     ED Results / Procedures / Treatments   Labs (all labs ordered are listed, but only abnormal results are displayed) Labs Reviewed  COMPREHENSIVE METABOLIC PANEL - Abnormal; Notable for the following components:      Result Value   Glucose, Bld 106 (*)    Calcium 8.5 (*)    AST 52 (*)    ALT 49 (*)    All other components within normal limits  CBC WITH DIFFERENTIAL/PLATELET  D-DIMER, QUANTITATIVE (NOT AT [facility])  TROPONIN I (HIGH SENSITIVITY)    EKG EKG Interpretation  Date/Time:  Thursday April 08 2020 09:28:01 EDT Ventricular Rate:  81 PR Interval:    QRS Duration: 100 QT Interval:  374 QTC Calculation: 435 R Axis:   154 Text Interpretation: Sinus rhythm Prolonged PR interval Left atrial enlargement Left posterior fascicular block Abnormal R-wave progression, late transition Confirmed by Tilden Fossa [id number]) on 04/08/2020 9:35:46 AM   Radiology DG Chest Portable 1 View  Result Date: 04/08/2020 CLINICAL DATA:  Chest pain, history of COVID EXAM: PORTABLE CHEST 1 VIEW COMPARISON:  08/23/2010 FINDINGS: Trachea midline. Cardiomediastinal contours and hilar structures are normal. Lungs are clear. No acute skeletal process on limited assessment. IMPRESSION: No acute cardiopulmonary disease. Electronically Signed   By: Donzetta Kohut M.D.   On: 04/08/2020 09:42    Procedures Procedures (including critical care time)  Medications Ordered in ED Medications - No data to display  ED Course  I have reviewed the triage vital signs and the nursing notes.  Pertinent labs & imaging results that were available during my care of the patient were reviewed by me and considered in my medical decision making (see chart for details).    MDM Rules/Calculators/A&P                         Patient with recent outpatient diagnosis of COVID-19 here for evaluation of chest pain and that is been waxing and waning since yesterday.  EKG without acute ischemic changes. Patient is well appearing on evaluation with no respiratory distress. Doubt PE, patient is low risk and D dimer is negative. No evidence of superimposed bacterial infection. Presentation is not consistent with ACS, myocarditis, pericarditis. Discussed with patient home care for atypical chest pain, likely chest wall pain. Discussed outpatient follow-up and return precautions.  LFTs mildly elevated, similar when compared to priors. Chest x-ray without focal infiltrate, pneumothorax.  Benjamin Warner was evaluated in Emergency Department on 04/08/2020 for the symptoms described in the  history of present illness. He was evaluated in the context of the global COVID-19 pandemic, which necessitated consideration that the patient might be at risk for infection with the SARS-CoV-2 virus that causes COVID-19. Institutional protocols and algorithms that pertain to the evaluation of patients at risk for COVID-19 are in a state of rapid change based on information released by regulatory bodies including the CDC and federal and state organizations. These policies and algorithms were followed during the patient's care in the ED.  Final Clinical Impression(s) / ED Diagnoses Final diagnoses:  Chest wall pain  COVID-19 virus infection    Rx / DC Orders ED Discharge Orders    None       Tilden Fossa, MD 04/08/20 1035

## 2020-04-15 ENCOUNTER — Other Ambulatory Visit: Payer: Self-pay

## 2020-04-15 ENCOUNTER — Emergency Department (HOSPITAL_BASED_OUTPATIENT_CLINIC_OR_DEPARTMENT_OTHER)
Admission: EM | Admit: 2020-04-15 | Discharge: 2020-04-15 | Disposition: A | Discharge status: Home / Self Care | Payer: 59 | Attending: Emergency Medicine | Admitting: Emergency Medicine

## 2020-04-15 ENCOUNTER — Encounter (HOSPITAL_BASED_OUTPATIENT_CLINIC_OR_DEPARTMENT_OTHER): Payer: Self-pay | Admitting: Emergency Medicine

## 2020-04-15 DIAGNOSIS — I10 Essential (primary) hypertension: Secondary | ICD-10-CM | POA: Insufficient documentation

## 2020-04-15 DIAGNOSIS — R5383 Other fatigue: Secondary | ICD-10-CM

## 2020-04-15 DIAGNOSIS — Z87891 Personal history of nicotine dependence: Secondary | ICD-10-CM | POA: Diagnosis not present

## 2020-04-15 DIAGNOSIS — U071 COVID-19: Secondary | ICD-10-CM

## 2020-04-15 MED ORDER — BENZONATATE 100 MG PO CAPS: 100.0000 mg | ORAL_CAPSULE | Freq: Three times a day (TID) | ORAL | 0 refills | Status: AC

## 2020-04-15 NOTE — ED Provider Notes (Signed)
[facility] EMERGENCY DEPARTMENT Provider Note   CSN: [id number] Arrival date & time: 04/15/20  0414     History Chief Complaint  Patient presents with  . Fatigue    Benjamin Warner is a 39 y.o. male.  The history is provided by the patient.  Illness Location:  Body  Quality:  Fatigue after eating  Severity:  Moderate Onset quality:  Gradual Timing:  Constant Progression:  Unchanged Chronicity:  Recurrent Context:  Has covid 19  Relieved by:  Nothing  Worsened by:  Nothing  Ineffective treatments:  None tried  Associated symptoms: fatigue   Associated symptoms: no abdominal pain, no chest pain, no congestion, no cough, no diarrhea, no ear pain, no fever, no headaches, no loss of consciousness, no myalgias, no rash, no rhinorrhea, no shortness of breath, no sore throat, no vomiting and no wheezing   Associated symptoms comment:  No SOB, no CP, no leg pain or swelling  Has covid 19 and felt like he was getting better but ate tonight and then felt fatigued and light headed.  No CP, no SOB.  Then couldn't sleep so he came in for evaluation.       Past Medical History:  Diagnosis Date  . Elevated CK    chronic, ~ 500, saw neuro, muscle Bx 12-2010: negative   . GERD (gastroesophageal reflux disease)   . Hypertension approx 2005  . Rhabdomyolysis 8/03   ck=20,000-unclear etiology  . Rhabdomyolysis   . Snoring    sleep study 03-2010----> negative for sleep apnea, Rx wt loss      Patient Active Problem List   Diagnosis Date Noted  . 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 Rhabdomyolysis 01/03/2013  . GERD (gastroesophageal reflux disease) 06/24/2012  . CTS (carpal tunnel syndrome) 02/08/2012  . General medical examination 11/10/2011  . HTN (hypertension) 11/10/2011  . Elevated CK   . Snoring 03/11/2010  . HEMORRHOIDS, INTERNAL W/O COMPLICATION 01/14/2007  . LIVER FUNCTION TESTS, ABNORMAL 10/29/2006    Past Surgical History:  Procedure Laterality Date  . CYST  EXCISION     2012---Excision of right upper back and left lower back epidermoid    . MUSCLE BIOPSY    . WISDOM TOOTH EXTRACTION         Family History  Problem Relation Age of Onset  . Hypertension Mother   . Heart attack Father 18  . Hypertension Father   . Hyperlipidemia Neg Hx   . Diabetes Neg Hx   . Sudden death Neg Hx     Social History   Tobacco Use  . Smoking status: Former Games developer  . Smokeless tobacco: Never Used  Substance Use Topics  . Alcohol use: Yes    Alcohol/week: 0.0 standard drinks    Comment: WEEKENDS  . Drug use: No    Types: Ketamine, MDMA (Ecstacy)    Comment: 10 years ago    Home Medications Prior to Admission medications   Medication Sig Start Date End Date Taking? Authorizing Provider  pantoprazole (PROTONIX) 40 MG tablet TAKE 1 TABLET(40 MG) BY MOUTH DAILY 12/02/18   Myrlene Broker, MD    Allergies    Patient has no known allergies.  Review of Systems   Review of Systems  Constitutional: Positive for fatigue. Negative for fever.  HENT: Negative for congestion, ear pain, rhinorrhea and sore throat.   Eyes: Negative for visual disturbance.  Respiratory: Negative for cough, shortness of breath and wheezing.   Cardiovascular: Negative for chest pain.  Gastrointestinal:  Negative for abdominal pain, diarrhea and vomiting.  Genitourinary: Negative for difficulty urinating.  Musculoskeletal: Negative for myalgias.  Skin: Negative for rash.  Neurological: Negative for loss of consciousness and headaches.  Psychiatric/Behavioral: Negative for agitation.  All other systems reviewed and are negative.   Physical Exam Updated Vital Signs BP 123/80   Pulse 88   Temp 98.5 F (36.9 C) (Oral)   Resp 16   Ht 5\' 10"  (1.778 m)   Wt 120.7 kg   SpO2 100%   BMI 38.18 kg/m   Physical Exam Vitals and nursing note reviewed.  Constitutional:      General: He is not in acute distress.    Appearance: Normal appearance.  HENT:     Head:  Normocephalic and atraumatic.     Nose: Nose normal.  Eyes:     Conjunctiva/sclera: Conjunctivae normal.     Pupils: Pupils are equal, round, and reactive to light.  Cardiovascular:     Rate and Rhythm: Normal rate and regular rhythm.     Pulses: Normal pulses.     Heart sounds: Normal heart sounds.  Pulmonary:     Effort: Pulmonary effort is normal.     Breath sounds: Normal breath sounds.  Abdominal:     General: Abdomen is flat. Bowel sounds are normal.     Palpations: Abdomen is soft.     Tenderness: There is no abdominal tenderness. There is no guarding or rebound.  Musculoskeletal:        General: Normal range of motion.     Cervical back: Normal range of motion and neck supple.  Skin:    General: Skin is warm and dry.     Capillary Refill: Capillary refill takes less than 2 seconds.  Neurological:     General: No focal deficit present.     Mental Status: He is alert and oriented to person, place, and time.     Deep Tendon Reflexes: Reflexes normal.  Psychiatric:        Mood and Affect: Mood normal.        Behavior: Behavior normal.     ED Results / Procedures / Treatments   Labs (all labs ordered are listed, but only abnormal results are displayed) Labs Reviewed - No data to display  EKG EKG Interpretation  Date/Time:  Thursday April 15 2020 04:25:15 EDT Ventricular Rate:  89 PR Interval:    QRS Duration: 91 QT Interval:  351 QTC Calculation: 427 R Axis:   75 Text Interpretation: Sinus rhythm Prolonged PR interval Confirmed by Hargis Vandyne (09-19-1996) on 04/15/2020 4:30:07 AM   Radiology No results found.  Procedures Procedures (including critical care time)  Medications Ordered in ED Medications - No data to display  ED Course  I have reviewed the triage vital signs and the nursing notes.  Pertinent labs & imaging results that were available during my care of the patient were reviewed by me and considered in my medical decision making (see chart  for details).    Well appearing.  Normal exam and vitals.  Fatigue is a part of covid.  Hydrate and eat well at home.  Take tylenol for fevers etc.    ANN GROENEVELD was evaluated in Emergency Department on 04/15/2020 for the symptoms described in the history of present illness. He was evaluated in the context of the global COVID-19 pandemic, which necessitated consideration that the patient might be at risk for infection with the SARS-CoV-2 virus that causes COVID-19. Institutional protocols and algorithms  that pertain to the evaluation of patients at risk for COVID-19 are in a state of rapid change based on information released by regulatory bodies including the CDC and federal and state organizations. These policies and algorithms were followed during the patient's care in the ED.  Final Clinical Impression(s) / ED Diagnoses Final diagnoses:  Fatigue, unspecified type  COVID-19   Return for intractable cough, coughing up blood,fevers >100.4 unrelieved by medication, shortness of breath, intractable vomiting, chest pain, shortness of breath, weakness,numbness, changes in speech, facial asymmetry,abdominal pain, passing out,Inability to tolerate liquids or food, cough, altered mental status or any concerns. No signs of systemic illness or infection. The patient is nontoxic-appearing on exam and vital signs are within normal limits.   I have reviewed the triage vital signs and the nursing notes. Pertinent labs &imaging results that were available during my care of the patient were reviewed by me and considered in my medical decision making (see chart for details).After history, exam, and medical workup I feel the patient has beenappropriately medically screened and is safe for discharge home. Pertinent diagnoses were discussed with the patient. Patient was given return precautions.   Adaley Kiene, MD 04/15/20 [phone]

## 2020-04-15 NOTE — ED Triage Notes (Signed)
Pt c/o feeling weak, fatigued and lightheaded. Pt dx with Covid 9/12 and began to feel better earlier yesterday and then felt worse after eating.

## 2021-12-25 ENCOUNTER — Encounter (HOSPITAL_BASED_OUTPATIENT_CLINIC_OR_DEPARTMENT_OTHER): Payer: Self-pay | Admitting: Emergency Medicine

## 2021-12-25 ENCOUNTER — Other Ambulatory Visit: Payer: Self-pay

## 2021-12-25 ENCOUNTER — Emergency Department (HOSPITAL_BASED_OUTPATIENT_CLINIC_OR_DEPARTMENT_OTHER)
Admission: EM | Admit: 2021-12-25 | Discharge: 2021-12-25 | Disposition: A | Discharge status: Home / Self Care | Payer: Commercial Managed Care - PPO | Attending: Emergency Medicine | Admitting: Emergency Medicine

## 2021-12-25 ENCOUNTER — Emergency Department (HOSPITAL_BASED_OUTPATIENT_CLINIC_OR_DEPARTMENT_OTHER): Payer: Commercial Managed Care - PPO

## 2021-12-25 DIAGNOSIS — M25562 Pain in left knee: Secondary | ICD-10-CM | POA: Diagnosis present

## 2021-12-25 MED ORDER — LIDOCAINE 5 % EX PTCH: 1.0000 | MEDICATED_PATCH | CUTANEOUS | 0 refills | Status: AC

## 2021-12-25 MED ORDER — NAPROXEN 500 MG PO TABS: 500.0000 mg | ORAL_TABLET | Freq: Two times a day (BID) | ORAL | 0 refills | Status: DC

## 2021-12-25 MED ORDER — ACETAMINOPHEN 325 MG PO TABS
650.0000 mg | ORAL_TABLET | Freq: Once | ORAL | Status: AC
  Administered 2021-12-25: 650 mg via ORAL
  Filled 2021-12-25: qty 2

## 2021-12-25 MED ORDER — METHOCARBAMOL 500 MG PO TABS: 500.0000 mg | ORAL_TABLET | Freq: Two times a day (BID) | ORAL | 0 refills | Status: AC

## 2021-12-25 MED ORDER — LIDOCAINE 5 % EX PTCH
1.0000 | MEDICATED_PATCH | CUTANEOUS | Status: DC
  Administered 2021-12-25: 1 via TRANSDERMAL
  Filled 2021-12-25: qty 1

## 2021-12-25 NOTE — Discharge Instructions (Signed)
It was a pleasure taking care of you here in the emergency department  As we discussed your x-ray showed a small chip of bone, radiologist thought this appeared to be old however given the location of your pain we will treat as if this is new.  I placed you in a splint and crutches.  Try to stay off of your leg over the next few days.  May gradually increase weightbearing as tolerated.  May take the medications have given you for pain as well.  Follow-up with orthopedics.  If you do not have 1 I have listed 1 in your discharge paperwork.  You will need to call them to schedule an appointment.  Return for new or worsening symptoms such as swelling, redness, warmth, numbness or weakness in the leg

## 2021-12-25 NOTE — ED Notes (Signed)
Patient states that left knee begin to hurt today around 12.Denies any injury to knee. Knee has limited movement. States that he applied ice and heat pack for relief.

## 2021-12-25 NOTE — ED Triage Notes (Signed)
Pt c/o left knee pain onset today about 2 hours prior to arrival. Pain started when he was putting on his shoes.

## 2021-12-25 NOTE — ED Provider Notes (Signed)
[facility] EMERGENCY DEPARTMENT Provider Note   CSN: [id number] Arrival date & time: 12/25/21  1253    History  Chief Complaint  Patient presents with   Knee Pain    Benjamin Warner is a 41 y.o. male here for evaluation of left knee pain which began 2 hours PTA.  Patient states he stood up, slightly everted his left leg, bent down to tie his shoes.  Felt a pulling sensation immediate pain to the medial aspect of his left knee.  He has been able to ambulate however states he has pain with ambulation.  No meds PTA. Pain located to medial aspect left knee.  No pain to femur, tib-fib.  Did state he has seen orthopedics previously however many years ago due to sports injuries.  No fever, swelling, redness, warmth, numbness, weakness, decreased range of motion, rash or lesions.  HPI     Home Medications Prior to Admission medications   Medication Sig Start Date End Date Taking? Authorizing Provider  lidocaine (LIDODERM) 5 % Place 1 patch onto the skin daily. Remove & Discard patch within 12 hours or as directed by MD 12/25/21  Yes Alexxis Mackert A, PA-C  methocarbamol (ROBAXIN) 500 MG tablet Take 1 tablet (500 mg total) by mouth 2 (two) times daily. 12/25/21  Yes Kayal Mula A, PA-C  naproxen (NAPROSYN) 500 MG tablet Take 1 tablet (500 mg total) by mouth 2 (two) times daily. 12/25/21  Yes Tarsha Blando A, PA-C  benzonatate (TESSALON) 100 MG capsule Take 1 capsule (100 mg total) by mouth every 8 (eight) hours. 04/15/20   Palumbo, April, MD  pantoprazole (PROTONIX) 40 MG tablet TAKE 1 TABLET(40 MG) BY MOUTH DAILY 12/02/18   Myrlene Brokerrawford, Elizabeth A, MD      Allergies    Patient has no known allergies.    Review of Systems   Review of Systems  Constitutional: Negative.   HENT: Negative.    Respiratory: Negative.    Cardiovascular: Negative.   Gastrointestinal: Negative.   Genitourinary: Negative.   Musculoskeletal:        Medial aspect left knee pain  Skin: Negative.    Neurological: Negative.   All other systems reviewed and are negative.  Physical Exam Updated Vital Signs BP (!) 152/84 (BP Location: Left Arm)   Pulse 83   Temp 98.3 F (36.8 C) (Oral)   Resp 20   Ht 5\' 10"  (1.778 m)   Wt 102.1 kg   SpO2 98%   BMI 32.28 kg/m  Physical Exam Vitals and nursing note reviewed.  Constitutional:      General: He is not in acute distress.    Appearance: He is well-developed. He is not ill-appearing, toxic-appearing or diaphoretic.  HENT:     Head: Normocephalic and atraumatic.     Nose: Nose normal.     Mouth/Throat:     Mouth: Mucous membranes are moist.  Eyes:     Pupils: Pupils are equal, round, and reactive to light.  Cardiovascular:     Rate and Rhythm: Normal rate and regular rhythm.     Pulses: Normal pulses.          Popliteal pulses are 2+ on the left side.       Dorsalis pedis pulses are 2+ on the left side.       Posterior tibial pulses are 2+ on the left side.     Heart sounds: Normal heart sounds.  Pulmonary:     Effort: Pulmonary effort is normal.  No respiratory distress.  Abdominal:     General: There is no distension.     Palpations: Abdomen is soft.  Musculoskeletal:        General: Normal range of motion.     Cervical back: Normal range of motion and neck supple.     Comments: Able to straight leg raise bilaterally.  Able to flex and extend bilaterally at knee without difficulty.  No shortening or rotation of legs no bony tenderness to femur, tib-fib, feet.  Focal tenderness to medial aspect medial joint line left knee.  Negative anterior drawer.  Some mild tenderness with obvious deviation knee.  No overlying erythema, warmth, obvious effusion.  Skin:    General: Skin is warm and dry.     Capillary Refill: Capillary refill takes less than 2 seconds.  Neurological:     General: No focal deficit present.     Mental Status: He is alert and oriented to person, place, and time.     Cranial Nerves: Cranial nerves 2-12 are  intact.     Sensory: Sensation is intact.     Motor: Motor function is intact.     Coordination: Coordination is intact.     Gait: Gait is intact.    ED Results / Procedures / Treatments   Labs (all labs ordered are listed, but only abnormal results are displayed) Labs Reviewed - No data to display  EKG None  Radiology DG Knee Complete 4 Views Left  Result Date: 12/25/2021 CLINICAL DATA:  Acute LEFT knee pain. No known injury. Initial encounter. EXAM: LEFT KNEE - COMPLETE 4+ VIEW COMPARISON:  None Available. FINDINGS: No acute fracture, subluxation or dislocation identified. A small well-corticated bony density along the MEDIAL joint is new since 2013, but does not appear acute. Mild tricompartmental degenerative changes are noted. No joint effusion is present. IMPRESSION: 1. Small well-corticated bony density along the MEDIAL joint, new since 2013, but likely a chronic ossification. Correlate with pain. 2. Mild tricompartmental degenerative changes. Electronically Signed   By: Harmon Pier M.D.   On: 12/25/2021 13:47    Procedures .Ortho Injury Treatment  Date/Time: 12/25/2021 3:34 PM Performed by: Linwood Dibbles, PA-C Authorized by: Linwood Dibbles, PA-C   Consent:    Consent obtained:  Verbal   Consent given by:  Patient   Risks discussed:  Fracture, nerve damage, restricted joint movement, vascular damage, stiffness, recurrent dislocation and irreducible dislocation   Alternatives discussed:  No treatment, alternative treatment, immobilization, referral and delayed treatmentInjury location: knee Location details: left knee Injury type: soft tissue Pre-procedure neurovascular assessment: neurovascularly intact Pre-procedure distal perfusion: normal Pre-procedure neurological function: normal Pre-procedure range of motion: normal  Anesthesia: Local anesthesia used: no  Patient sedated: NoImmobilization: brace and crutches Splint Applied by: ED Tech Post-procedure  neurovascular assessment: post-procedure neurovascularly intact Post-procedure distal perfusion: normal Post-procedure neurological function: normal Post-procedure range of motion: normal      Medications Ordered in ED Medications  lidocaine (LIDODERM) 5 % 1 patch (1 patch Transdermal Patch Applied 12/25/21 1512)  acetaminophen (TYLENOL) tablet 650 mg (650 mg Oral Given 12/25/21 1512)    ED Course/ Medical Decision Making/ A&P     41 year old here for evaluation of knee pain which began earlier today.  Pain began after he slightly everted leg and foot bent down to tie his shoes.  Patient neurovascularly intact.  He has no overlying skin changes such as edema, erythema or warmth.  I low suspicion for septic joint or infectious process.  He has no obvious effusion, low suspicion for gout.  No clinical evidence of VTE on exam, low suspicion for vascular occlusion, neurovascular intact with equal pulses bilaterally.  No evidence of ischemia.  Does have some tenderness at his medial joint line however full range of motion.  Low suspicion for occult fracture, dislocation.  X-ray obtained here which I personally viewed and interpreted shows:  X-ray left knee shows small well-corticated bone density along the medial joint new since 2013 however appears chronic based off ossification.  Also has mild degenerative changes.  Given patient's focal tenderness to this area he was placed in a new splint, provided crutches.  Discussed RICE for symptomatic management close follow-up with orthopedics.  He is agreeable.  The patient has been appropriately medically screened and/or stabilized in the ED. I have low suspicion for any other emergent medical condition which would require further screening, evaluation or treatment in the ED or require inpatient management.  Patient is hemodynamically stable and in no acute distress.  Patient able to ambulate in department prior to ED.  Evaluation does not show acute  pathology that would require ongoing or additional emergent interventions while in the emergency department or further inpatient treatment.  I have discussed the diagnosis with the patient and answered all questions.  Pain is been managed while in the emergency department and patient has no further complaints prior to discharge.  Patient is comfortable with plan discussed in room and is stable for discharge at this time.  I have discussed strict return precautions for returning to the emergency department.  Patient was encouraged to follow-up with PCP/specialist refer to at discharge.                            Medical Decision Making Amount and/or Complexity of Data Reviewed External Data Reviewed: radiology. Radiology: ordered and independent interpretation performed. Decision-making details documented in ED Course.  Risk OTC drugs. Prescription drug management. Diagnosis or treatment significantly limited by social determinants of health.          Final Clinical Impression(s) / ED Diagnoses Final diagnoses:  Acute pain of left knee    Rx / DC Orders ED Discharge Orders          Ordered    lidocaine (LIDODERM) 5 %  Every 24 hours        12/25/21 1538    naproxen (NAPROSYN) 500 MG tablet  2 times daily        12/25/21 1538    methocarbamol (ROBAXIN) 500 MG tablet  2 times daily        12/25/21 1538              Brison Fiumara A, PA-C 12/25/21 2244    Franne Forts, DO 12/25/21 2320

## 2021-12-28 ENCOUNTER — Ambulatory Visit (HOSPITAL_BASED_OUTPATIENT_CLINIC_OR_DEPARTMENT_OTHER)
Admission: RE | Admit: 2021-12-28 | Discharge: 2021-12-28 | Disposition: A | Discharge status: Home / Self Care | Payer: Commercial Managed Care - PPO | Source: Ambulatory Visit | Attending: Family Medicine | Admitting: Family Medicine

## 2021-12-28 ENCOUNTER — Ambulatory Visit: Payer: Commercial Managed Care - PPO | Admitting: Family Medicine

## 2021-12-28 ENCOUNTER — Encounter: Payer: Self-pay | Admitting: Family Medicine

## 2021-12-28 ENCOUNTER — Ambulatory Visit: Payer: Self-pay

## 2021-12-28 VITALS — BP 124/90 | Ht 70.0 in | Wt 225.0 lb

## 2021-12-28 DIAGNOSIS — S46211A Strain of muscle, fascia and tendon of other parts of biceps, right arm, initial encounter: Secondary | ICD-10-CM | POA: Insufficient documentation

## 2021-12-28 DIAGNOSIS — M19029 Primary osteoarthritis, unspecified elbow: Secondary | ICD-10-CM | POA: Insufficient documentation

## 2021-12-28 DIAGNOSIS — M899 Disorder of bone, unspecified: Secondary | ICD-10-CM | POA: Diagnosis not present

## 2021-12-28 DIAGNOSIS — M659 Synovitis and tenosynovitis, unspecified: Secondary | ICD-10-CM | POA: Diagnosis not present

## 2021-12-28 DIAGNOSIS — M65962 Unspecified synovitis and tenosynovitis, left lower leg: Secondary | ICD-10-CM | POA: Insufficient documentation

## 2021-12-28 NOTE — Assessment & Plan Note (Signed)
Acutely occurring.  No significant pain today and has good range of motion. -Counseled on home exercise therapy and supportive care. -Provided work note.

## 2021-12-28 NOTE — Progress Notes (Signed)
  Benjamin Warner - 41 y.o. male MRN [id number]  Date of birth: 09-Jul-1981  SUBJECTIVE:  Including CC & ROS.  No chief complaint on file.   Benjamin Warner is a 41 y.o. male that is presenting with acute left knee pain, right periscapular pain and acute on chronic right elbow pain.  He was seen in the emergency department recently for his left knee pain.  Denies any significant pain today.  Having pain in the superior border of the scapula.  This is intermittent in nature.  No radicular pain..  Review of the emergency department note from 6/4 shows he is treated with supportive care. Independent review of the left knee x-ray shows a calcification is likely chronic in nature along the medial border of the proximal tibia.  Review of Systems See HPI   HISTORY: Past Medical, Surgical, Social, and Family History Reviewed & Updated per EMR.   Pertinent Historical Findings include:  Past Medical History:  Diagnosis Date   Elevated CK    chronic, ~ 500, saw neuro, muscle Bx 12-2010: negative    GERD (gastroesophageal reflux disease)    Hypertension approx 2005   Rhabdomyolysis 8/03   ck=20,000-unclear etiology   Rhabdomyolysis    Snoring    sleep study 03-2010----> negative for sleep apnea, Rx wt loss      Past Surgical History:  Procedure Laterality Date   CYST EXCISION     2012---Excision of right upper back and left lower back epidermoid     MUSCLE BIOPSY     WISDOM TOOTH EXTRACTION       PHYSICAL EXAM:  VS: BP 124/90 (BP Location: Left Arm, Patient Position: Sitting)   Ht 5\' 10"  (1.778 m)   Wt 225 lb (102.1 kg)   BMI 32.28 kg/m  Physical Exam Gen: NAD, alert, cooperative with exam, well-appearing MSK:  Right elbow: Normal range of motion. Weakness with resistance to flexion. Tenderness over the palpation of the distal biceps tendon. Hook sign is not as evident as compared to contralateral side. Neurovascularly intact    Limited ultrasound: Right elbow:  There appears  to be thickening and partial tears of the biceps tendon as it nears the insertion.  This is true when compared to the contralateral side.  Summary: Partial tears of the distal biceps tendon.  Ultrasound and interpretation by , MD    ASSESSMENT & PLAN:   Rupture of right distal biceps tendon Acute on chronic in nature.  Pain has been evident for 6 months.  Has done rest and anti-inflammatories with limited improvement.  The pain always returns.  Performs heavy resistance training on a regular basis.  Has been started in physical therapy and this exacerbates his pain. -Counseled on home exercise therapy and supportive care. -X-ray. -MRI of the right elbow to evaluate for rupture of the distal biceps tendon and for presurgical planning.  Synovitis of left knee Acutely occurring.  No significant pain today and has good range of motion. -Counseled on home exercise therapy and supportive care. -Provided work note.   Scapular dysfunction Acutely occurring.  This is intermittent in nature.  Having more pain along the superior border of the right scapula.  Occurs when he is reaching out or reaching up -Counseled on home exercise therapy and supportive care. -Could consider injection or physical therapy.

## 2021-12-28 NOTE — Patient Instructions (Signed)
Nice to meet you Please try the exercises  I will call with the xray results.  We'll get the MRI at [facility]   Please send me a message in MyChart with any questions or updates.  We'll setup a virtual visit once the MRI is resulted.   --Dr. Jordan Likes

## 2021-12-28 NOTE — Assessment & Plan Note (Signed)
Acute on chronic in nature.  Pain has been evident for 6 months.  Has done rest and anti-inflammatories with limited improvement.  The pain always returns.  Performs heavy resistance training on a regular basis.  Has been started in physical therapy and this exacerbates his pain. -Counseled on home exercise therapy and supportive care. -X-ray. -MRI of the right elbow to evaluate for rupture of the distal biceps tendon and for presurgical planning.

## 2021-12-28 NOTE — Assessment & Plan Note (Signed)
Acutely occurring.  This is intermittent in nature.  Having more pain along the superior border of the right scapula.  Occurs when he is reaching out or reaching up -Counseled on home exercise therapy and supportive care. -Could consider injection or physical therapy.

## 2021-12-30 ENCOUNTER — Telehealth: Payer: Self-pay | Admitting: Family Medicine

## 2021-12-30 NOTE — Telephone Encounter (Signed)
Informed on results.   Myra Rude, MD [facility] 12/30/2021, 2:10 PM

## 2022-01-19 ENCOUNTER — Telehealth: Payer: Self-pay | Admitting: Family Medicine

## 2022-01-19 ENCOUNTER — Other Ambulatory Visit: Payer: Self-pay | Admitting: Family Medicine

## 2022-01-19 MED ORDER — DIAZEPAM 5 MG PO TABS: ORAL_TABLET | ORAL | 0 refills | Status: DC

## 2022-01-19 NOTE — Progress Notes (Signed)
Provided valium for MRI.   Myra Rude, MD [facility] 01/19/2022, 3:10 PM

## 2022-01-22 ENCOUNTER — Other Ambulatory Visit: Payer: Commercial Managed Care - PPO

## 2022-01-23 ENCOUNTER — Other Ambulatory Visit: Payer: Self-pay | Admitting: *Deleted

## 2022-01-23 ENCOUNTER — Telehealth: Payer: Self-pay | Admitting: Family Medicine

## 2022-01-23 ENCOUNTER — Ambulatory Visit (INDEPENDENT_AMBULATORY_CARE_PROVIDER_SITE_OTHER): Payer: Commercial Managed Care - PPO

## 2022-01-23 ENCOUNTER — Other Ambulatory Visit: Payer: Self-pay | Admitting: Sports Medicine

## 2022-01-23 DIAGNOSIS — S46211A Strain of muscle, fascia and tendon of other parts of biceps, right arm, initial encounter: Secondary | ICD-10-CM

## 2022-01-23 MED ORDER — DIAZEPAM 5 MG PO TABS: ORAL_TABLET | ORAL | 0 refills | Status: DC

## 2022-01-23 NOTE — Telephone Encounter (Signed)
Pt cld states MRI machine trouble minutes before he was to get in, pt had already taken  Valium .[facility] rescheduled him to come today 7/3 & he needs a refill for :  diazepam (VALIUM) 5 MG tablet [[id number]]    Order Details Dose, Route, Frequency: As Directed  Dispense Quantity: 2 tablet Refills: 0   Indications of Use: Vertigo       Sig: Please take 30-45 minutes before procedure. May repeat x 1.        Send refill order to:  [id number] - [place], [place] - [address] AT [facility] OF [address] Phone:  [phone]  Fax:  [phone]     glh

## 2022-01-26 ENCOUNTER — Telehealth: Payer: Commercial Managed Care - PPO | Admitting: Family Medicine

## 2022-01-27 ENCOUNTER — Telehealth (INDEPENDENT_AMBULATORY_CARE_PROVIDER_SITE_OTHER): Payer: Commercial Managed Care - PPO | Admitting: Family Medicine

## 2022-01-27 VITALS — Ht 70.0 in | Wt 225.0 lb

## 2022-01-27 DIAGNOSIS — M19029 Primary osteoarthritis, unspecified elbow: Secondary | ICD-10-CM

## 2022-01-27 NOTE — Assessment & Plan Note (Signed)
Right sided. Symptoms seem most consistent with the degenerative changes appreciated within the elbow joint observed on MRI. -Counseled on home exercise therapy and supportive care. -Pursue injection.

## 2022-01-27 NOTE — Progress Notes (Signed)
Virtual Visit via Video Note  I connected with Benjamin Warner on 01/27/22 at  8:00 AM EDT by a video enabled telemedicine application and verified that I am speaking with the correct person using two identifiers.  Location: Patient: vehicle  Provider: office   I discussed the limitations of evaluation and management by telemedicine and the availability of in person appointments. The patient expressed understanding and agreed to proceed.  History of Present Illness:  Benjamin Warner is a 41 year old male that is following up after the MRI of his right elbow.  This was demonstrating biceps tendon tendinosis as well as arthritic change in the elbow  Observations/Objective:   Assessment and Plan:  Osteoarthritis of right elbow: Symptoms seem most consistent with the degenerative changes appreciated within the elbow joint observed on MRI. -Counseled on home exercise therapy and supportive care. -Pursue injection.  Follow Up Instructions:    I discussed the assessment and treatment plan with the patient. The patient was provided an opportunity to ask questions and all were answered. The patient agreed with the plan and demonstrated an understanding of the instructions.   The patient was advised to call back or seek an in-person evaluation if the symptoms worsen or if the condition fails to improve as anticipated.    Clare Gandy, MD

## 2022-02-02 ENCOUNTER — Ambulatory Visit (INDEPENDENT_AMBULATORY_CARE_PROVIDER_SITE_OTHER): Payer: Commercial Managed Care - PPO | Admitting: Family Medicine

## 2022-02-02 ENCOUNTER — Encounter: Payer: Self-pay | Admitting: Family Medicine

## 2022-02-02 ENCOUNTER — Ambulatory Visit: Payer: Self-pay

## 2022-02-02 VITALS — BP 140/84 | Ht 70.0 in | Wt 225.0 lb

## 2022-02-02 DIAGNOSIS — M19029 Primary osteoarthritis, unspecified elbow: Secondary | ICD-10-CM | POA: Diagnosis not present

## 2022-02-02 DIAGNOSIS — M67921 Unspecified disorder of synovium and tendon, right upper arm: Secondary | ICD-10-CM | POA: Insufficient documentation

## 2022-02-02 MED ORDER — TRIAMCINOLONE ACETONIDE 40 MG/ML IJ SUSP
40.0000 mg | Freq: Once | INTRAMUSCULAR | Status: AC
  Administered 2022-02-02: 40 mg via INTRA_ARTICULAR

## 2022-02-02 MED ORDER — NITROGLYCERIN 0.2 MG/HR TD PT24: MEDICATED_PATCH | TRANSDERMAL | 11 refills | Status: AC

## 2022-02-02 NOTE — Assessment & Plan Note (Signed)
MRI showing degenerative changes. -Injection today.

## 2022-02-02 NOTE — Patient Instructions (Addendum)
Good to see you Please use ice as needed  Please continue the exercises   Please send me a message in MyChart with any questions or updates.  Please see me back in 4 weeks.   --Dr. Jordan Likes  Nitroglycerin Protocol  Apply 1/4 nitroglycerin patch to affected area daily. Change position of patch within the affected area every 24 hours. You may experience a headache during the first 1-2 weeks of using the patch, these should subside. If you experience headaches after beginning nitroglycerin patch treatment, you may take your preferred over the counter pain reliever. Another side effect of the nitroglycerin patch is skin irritation or rash related to patch adhesive. Please notify our office if you develop more severe headaches or rash, and stop the patch. Tendon healing with nitroglycerin patch may require 12 to 24 weeks depending on the extent of injury. Men should not use if taking Viagra, Cialis, or Levitra.  Do not use if you have migraines or rosacea.

## 2022-02-02 NOTE — Progress Notes (Signed)
  Benjamin Warner - 41 y.o. male MRN [id number]  Date of birth: 12/27/1980  SUBJECTIVE:  Including CC & ROS.  No chief complaint on file.   Benjamin Warner is a 41 y.o. male that is here for a right elbow injection.    Review of Systems See HPI   HISTORY: Past Medical, Surgical, Social, and Family History Reviewed & Updated per EMR.   Pertinent Historical Findings include:  Past Medical History:  Diagnosis Date   Elevated CK    chronic, ~ 500, saw neuro, muscle Bx 12-2010: negative    GERD (gastroesophageal reflux disease)    Hypertension approx 2005   Rhabdomyolysis 8/03   ck=20,000-unclear etiology   Rhabdomyolysis    Snoring    sleep study 03-2010----> negative for sleep apnea, Rx wt loss      Past Surgical History:  Procedure Laterality Date   CYST EXCISION     2012---Excision of right upper back and left lower back epidermoid     MUSCLE BIOPSY     WISDOM TOOTH EXTRACTION       PHYSICAL EXAM:  VS: BP 140/84 (BP Location: Left Arm, Patient Position: Sitting)   Ht 5\' 10"  (1.778 m)   Wt 225 lb (102.1 kg)   BMI 32.28 kg/m  Physical Exam Gen: NAD, alert, cooperative with exam, well-appearing MSK:  Neurovascularly intact     Aspiration/Injection Procedure Note Benjamin Warner Jul 14, 1981  Procedure: Injection Indications: Right elbow pain  Procedure Details Consent: Risks of procedure as well as the alternatives and risks of each were explained to the (patient/caregiver).  Consent for procedure obtained. Time Out: Verified patient identification, verified procedure, site/side was marked, verified correct patient position, special equipment/implants available, medications/allergies/relevent history reviewed, required imaging and test results available.  Performed.  The area was cleaned with iodine and alcohol swabs.    The right lateral elbow joint was injected using 3 cc of 1% lidocaine on a 25-gauge 1-1/2 inch needle.  The syringe was switched and a mixture  containing 1 cc's of 40 mg Kenalog and 2 cc's of 0.25% bupivacaine was injected.  Ultrasound was used. Images were obtained in long views showing the injection.     A sterile dressing was applied.  Patient did tolerate procedure well.     ASSESSMENT & PLAN:   Elbow arthritis MRI showing degenerative changes. -Injection today.  Tendinopathy of right biceps tendon Showing changes on MRI  - counseled on home exercise therapy and supportive care - nitro patches

## 2022-02-02 NOTE — Assessment & Plan Note (Signed)
Showing changes on MRI  - counseled on home exercise therapy and supportive care - nitro patches

## 2022-03-01 ENCOUNTER — Ambulatory Visit: Payer: Commercial Managed Care - PPO | Admitting: Family Medicine

## 2022-03-01 DIAGNOSIS — M19029 Primary osteoarthritis, unspecified elbow: Secondary | ICD-10-CM

## 2022-03-01 DIAGNOSIS — M67921 Unspecified disorder of synovium and tendon, right upper arm: Secondary | ICD-10-CM | POA: Diagnosis not present

## 2022-03-01 NOTE — Assessment & Plan Note (Signed)
Has done well since the injection. -Counseled on home exercise therapy and supportive care. -Could inject if needed.

## 2022-03-01 NOTE — Progress Notes (Signed)
  Benjamin Warner - 41 y.o. male MRN [id number]  Date of birth: 01-11-1981  SUBJECTIVE:  Including CC & ROS.  No chief complaint on file.   Benjamin Warner is a 41 y.o. male that is following up for his right elbow pain.  He has been doing well since the injection.  He has not used the nitroglycerin yet.   Review of Systems See HPI   HISTORY: Past Medical, Surgical, Social, and Family History Reviewed & Updated per EMR.   Pertinent Historical Findings include:  Past Medical History:  Diagnosis Date   Elevated CK    chronic, ~ 500, saw neuro, muscle Bx 12-2010: negative    GERD (gastroesophageal reflux disease)    Hypertension approx 2005   Rhabdomyolysis 8/03   ck=20,000-unclear etiology   Rhabdomyolysis    Snoring    sleep study 03-2010----> negative for sleep apnea, Rx wt loss      Past Surgical History:  Procedure Laterality Date   CYST EXCISION     2012---Excision of right upper back and left lower back epidermoid     MUSCLE BIOPSY     WISDOM TOOTH EXTRACTION       PHYSICAL EXAM:  VS: BP 126/70   Ht 5\' 10"  (1.778 m)   Wt 225 lb (102.1 kg)   BMI 32.28 kg/m  Physical Exam Gen: NAD, alert, cooperative with exam, well-appearing MSK:  Neurovascularly intact       ASSESSMENT & PLAN:   Tendinopathy of right biceps tendon Notices pain intermittently but nothing severe. -Counseled on home exercise therapy and supportive care. -Counseled on nitro patches.  Elbow arthritis Has done well since the injection. -Counseled on home exercise therapy and supportive care. -Could inject if needed.

## 2022-03-01 NOTE — Assessment & Plan Note (Signed)
Notices pain intermittently but nothing severe. -Counseled on home exercise therapy and supportive care. -Counseled on nitro patches.

## 2022-11-06 ENCOUNTER — Encounter: Payer: Self-pay | Admitting: *Deleted

## 2023-03-12 IMAGING — DX DG ELBOW COMPLETE 3+V*R*
4 series · 4 of 4 positions shown · non-contrast
Comparison: None Available.

CLINICAL DATA: Right elbow pain and findings of biceps tendon
rupture

EXAM:
RIGHT ELBOW - COMPLETE 3+ VIEW

[elbow ap]
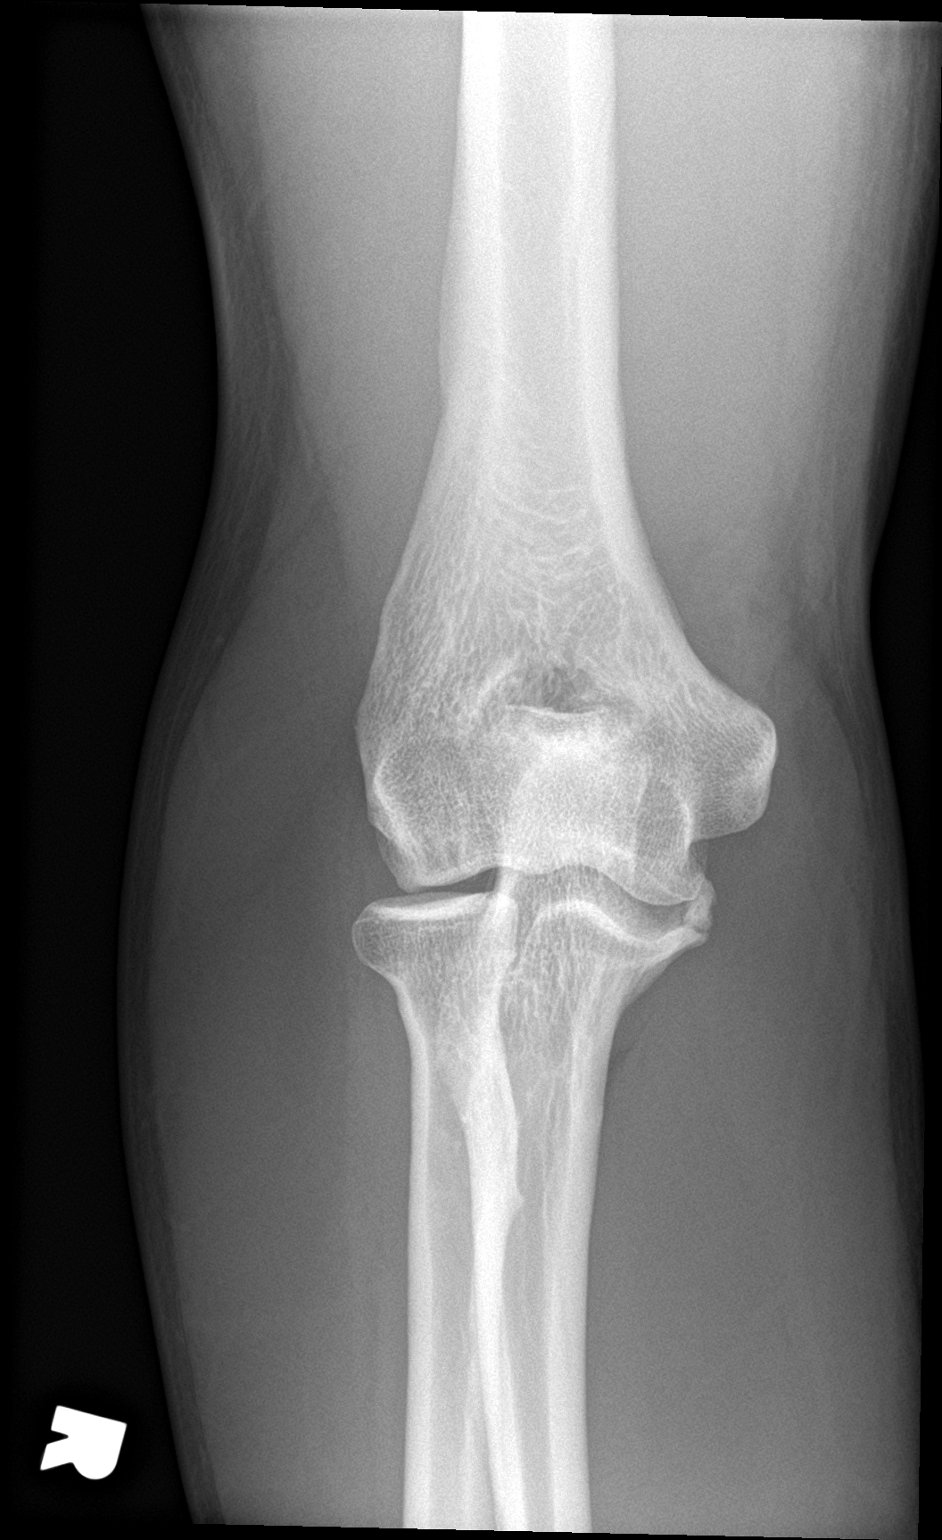

[elbow obl (1 of 2)]
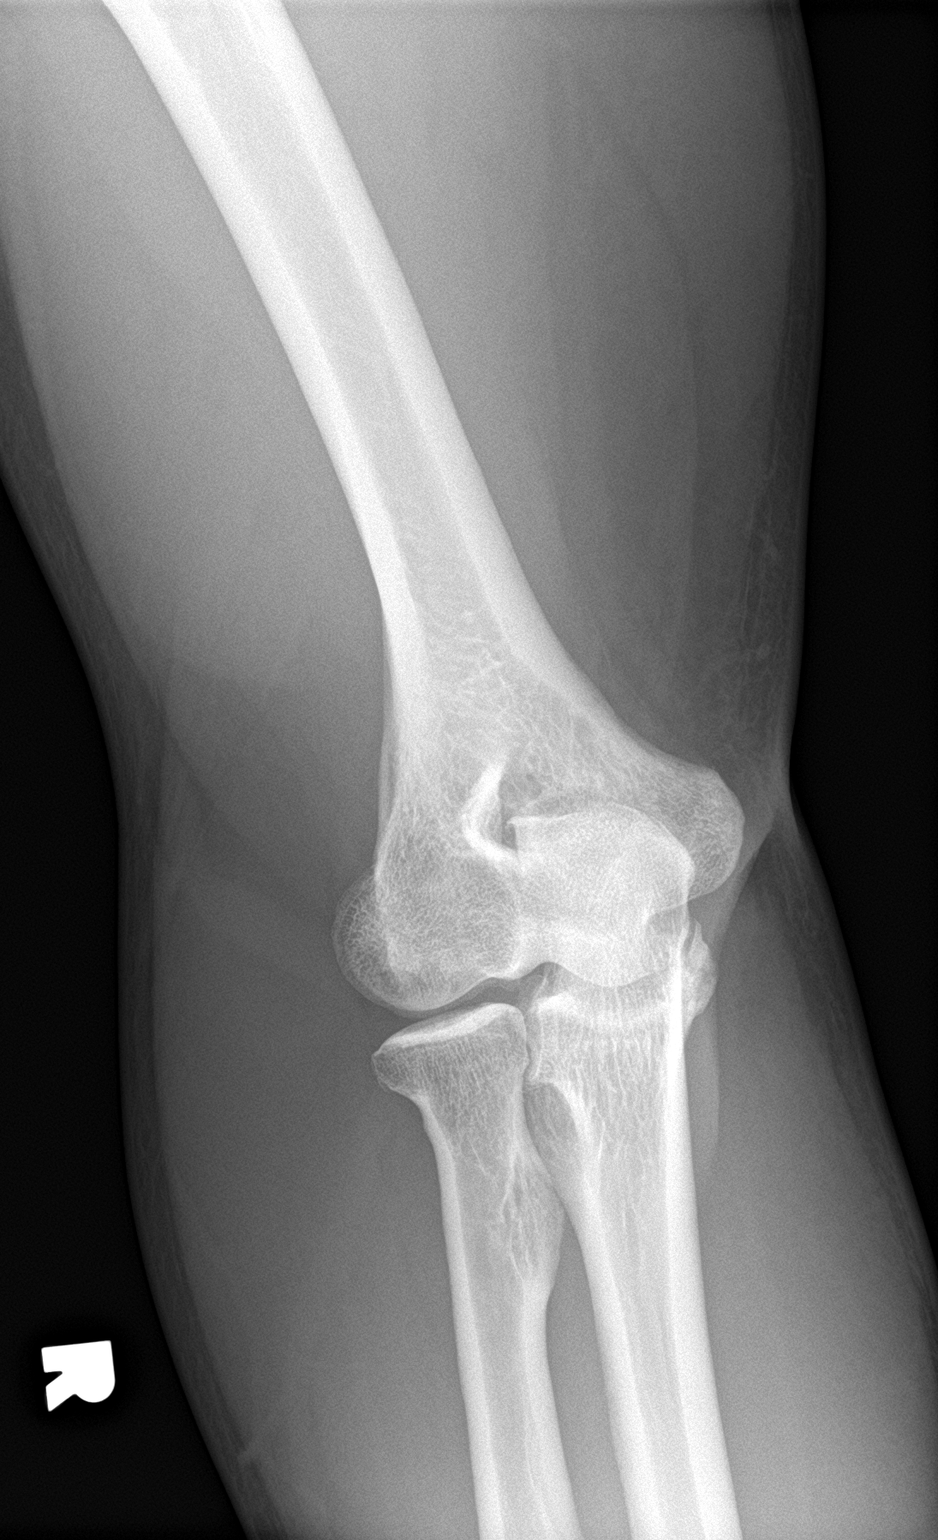

[elbow obl (2 of 2)]
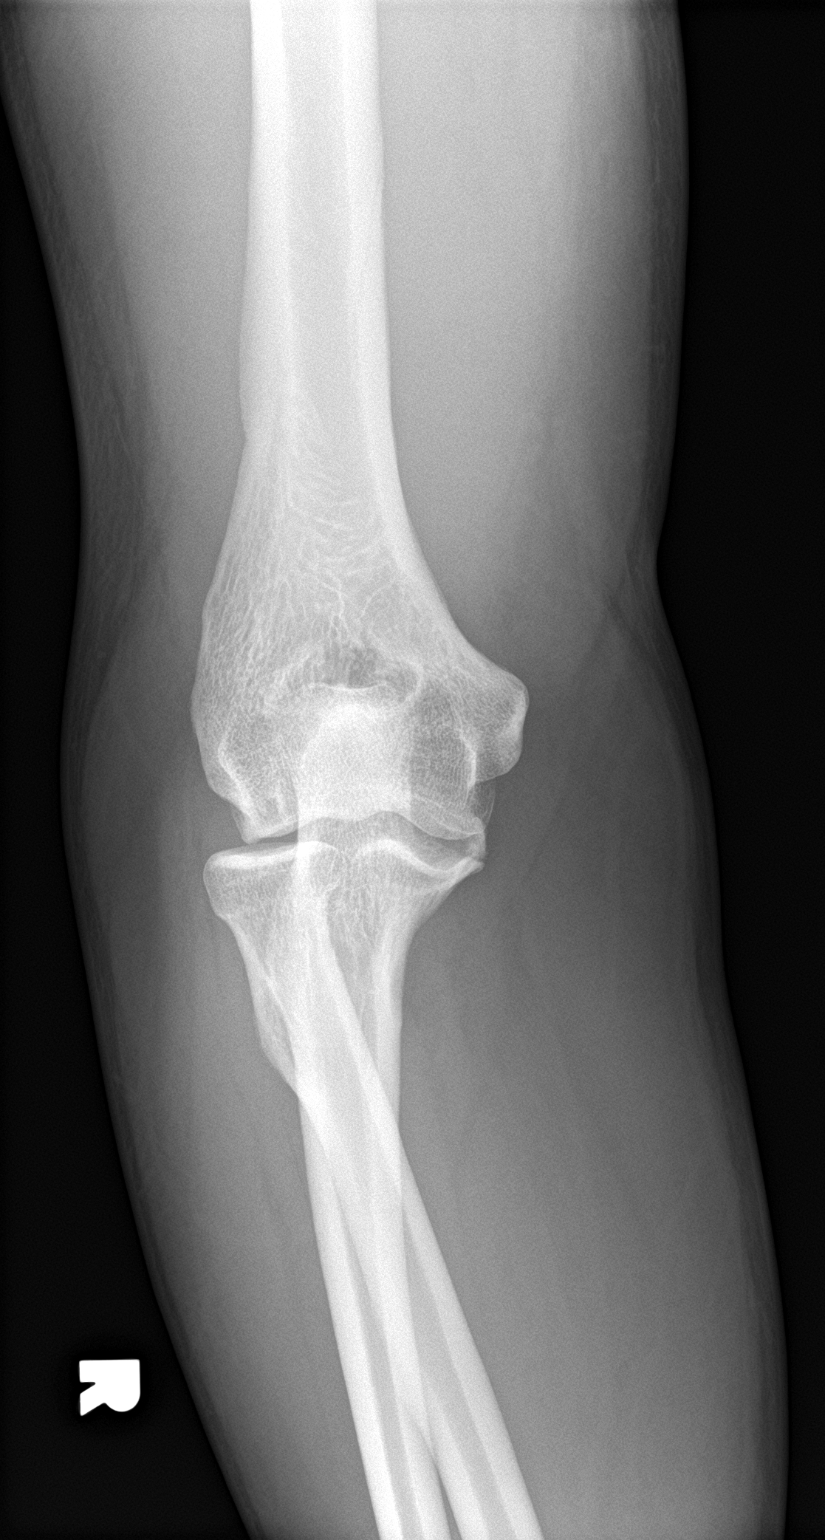

[elbow lat]
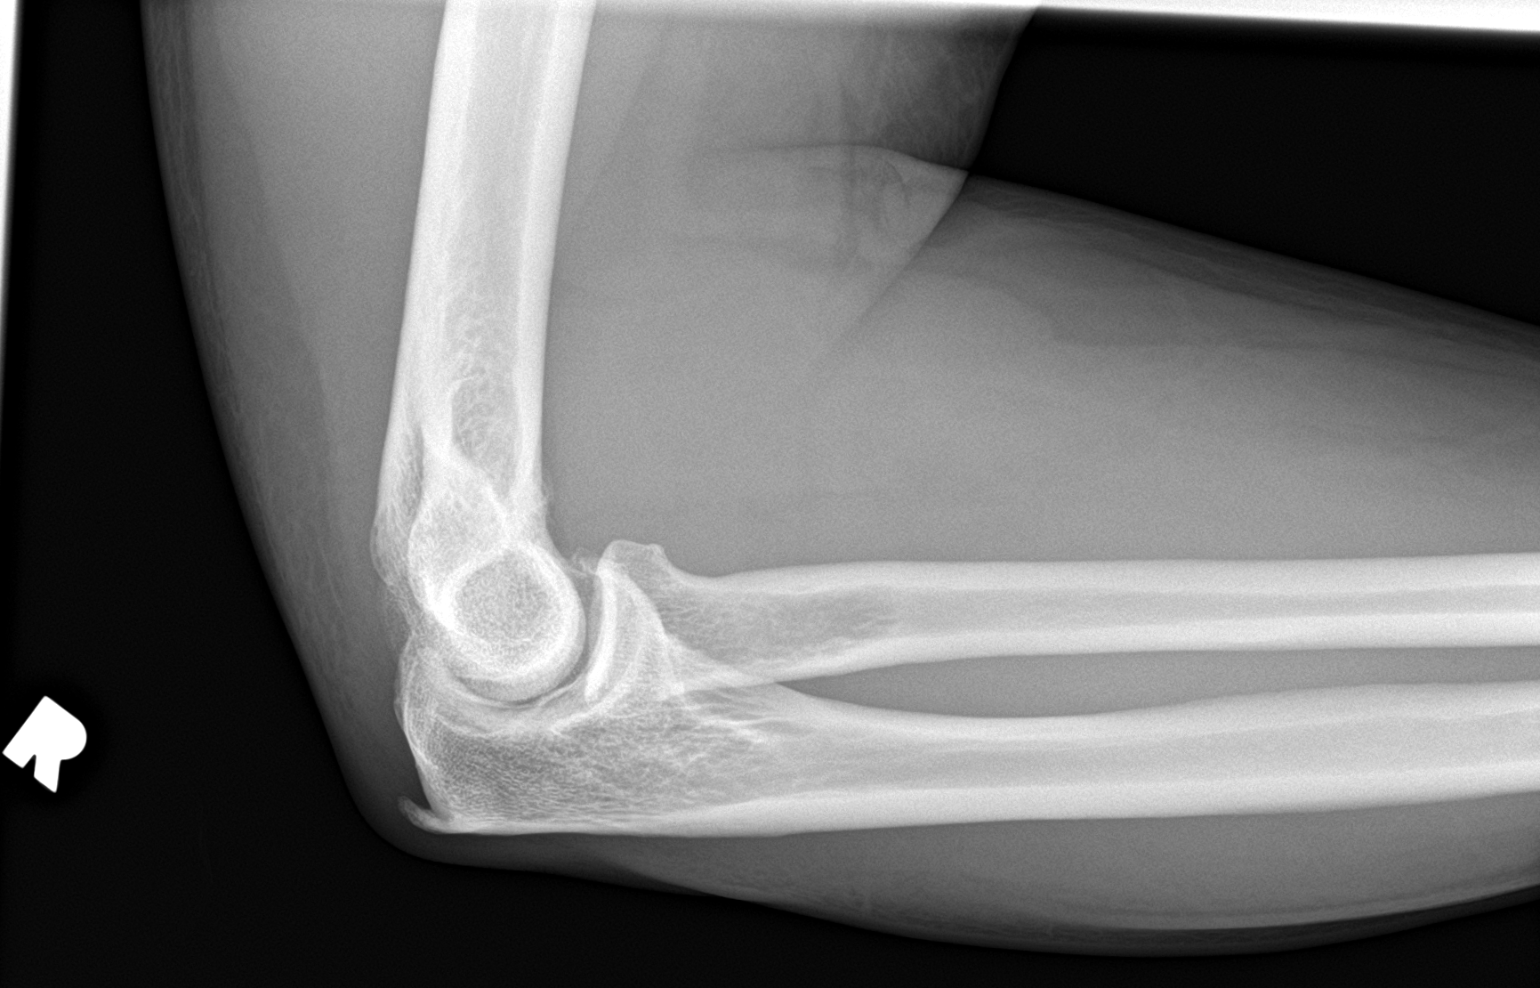

[4 of 4 positions shown; findings below may reference images not displayed]

FINDINGS: Degenerative changes are noted at the articulation of the proximal
ulna and distal humerus. No acute fracture or dislocation is noted.
Olecranon spurring is seen as well.
IMPRESSION: Degenerative change of the elbow joint.

## 2023-05-07 NOTE — Telephone Encounter (Signed)
Note opened in error.

## 2024-07-04 ENCOUNTER — Other Ambulatory Visit: Payer: Self-pay

## 2024-07-04 ENCOUNTER — Ambulatory Visit: Admitting: Sports Medicine

## 2024-07-04 ENCOUNTER — Encounter: Payer: Self-pay | Admitting: Sports Medicine

## 2024-07-04 DIAGNOSIS — M2141 Flat foot [pes planus] (acquired), right foot: Secondary | ICD-10-CM

## 2024-07-04 DIAGNOSIS — M2021 Hallux rigidus, right foot: Secondary | ICD-10-CM

## 2024-07-04 DIAGNOSIS — G8929 Other chronic pain: Secondary | ICD-10-CM

## 2024-07-04 DIAGNOSIS — M25461 Effusion, right knee: Secondary | ICD-10-CM

## 2024-07-04 DIAGNOSIS — M25561 Pain in right knee: Secondary | ICD-10-CM

## 2024-07-04 DIAGNOSIS — M19079 Primary osteoarthritis, unspecified ankle and foot: Secondary | ICD-10-CM

## 2024-07-04 DIAGNOSIS — M2142 Flat foot [pes planus] (acquired), left foot: Secondary | ICD-10-CM

## 2024-07-04 MED ORDER — DICLOFENAC SODIUM 50 MG PO TBEC: DELAYED_RELEASE_TABLET | ORAL | 0 refills | Status: AC

## 2024-07-04 NOTE — Progress Notes (Signed)
 Benjamin Warner - 43 y.o. male MRN [id number]  Date of birth: April 05, 1981  Office Visit Note: Visit Date: 07/04/2024 [facility]CP: [facility] Referred by: [facility]  Subjective: Chief Complaint  Patient presents with   Right Knee - Pain   Right Foot - Pain   HPI: Benjamin Warner is a pleasant 43 y.o. male who presents today for chronic right knee pain, also with 80-month history of R-great toe pain.  He states that he has had knee pain for years, and has been evaluated for it several times in the past. He does have constant pain, and at times his knee will become stiff and lock up, especially in the middle of the night. He does use ice and heat for the knee, but otherwise does not do anything to treat it, has used naproxen  at times. He played sports growing up, and while he does not remember a significant injury, he did have pain when playing, especially basketball. He says that as a kid he would jump off of the roof of their one story house, which could be contributing to his pain, although he never had a significant injury at that time, either.  He has done physical therapy and home exercises for the knee in the past and tries to remain active but the knee pain does prevent this at times.   Patient has had pain over the medial aspect of the right big toe for about 2 months. He says that his work boots are less painful, but his more comfortable and flexible shoes are more painful. He does have pain with AROM. He has not done anything at home to treat this pain specifically.  Pertinent ROS were reviewed with the patient and found to be negative unless otherwise specified above in HPI.   Assessment & Plan: Visit Diagnoses:  1. Hallux rigidus, right foot   2. 1st MTP arthritis   3. Pes planus of both feet   4. Chronic pain of right knee   5. Effusion, right knee   6. Patellofemoral arthralgia of right knee    Plan: Impression is great toe pain with evidence of hallux  rigidus and first MTP arthritic change.  Likely exacerbated given his pes planus and hyperpronation with medial column pushoff.  He is on his feet quite a bit especially infirm work boots.  We discussed referral for custom orthotics to help support and prevent his longitudinal arch collapse, likely would benefit from blue EVA padding or and/or first ray post.  May use topical Voltaren gel for the first MTP.  He has a chronic history of right knee pain and has seen several different providers for this over the last few years who did not give him any significant answers other than some degenerative change.  He does have at least a mild to moderate knee effusion as well as symptoms concerning for possible underlying meniscal abnormality.  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  He is interested in pain relief, we did discuss possible aspiration and/or injection, but I would like to further evaluate the true etiology of his pain to treat the primary source.  He has failed previous PT/HEP and over-the-counter medications. Given this, would like to pursue knee MRI to evaluate the meniscus, cartilage and other intra-articular structures.  To help with both pathologies in his inflammatory burden, we will start him on a short course of oral  diclofenac 50 mg twice daily x 1 week, followed by once daily x 1 additional week.  I will see him back in 1 week after MRI is complete to discuss and review next steps.  Follow-up: Return for F/u 1-week after Knee MRI to review and discuss next steps .   Meds & Orders:  Meds ordered this encounter  Medications   diclofenac (VOLTAREN) 50 MG EC tablet    Sig: Take 1 tablet (50mg ) twice daily x 1 week, followed by 1 tablet once daily for an additional week until completion. Take with food.    Dispense:  21 tablet    Refill:  0    Orders Placed This Encounter   Procedures   XR Foot Complete Right   MR Knee Right w/o contrast   AMB referral to sports medicine     Procedures: No procedures performed      Clinical History: No specialty comments available.  He reports that he has quit smoking. He has never used smokeless tobacco. No results for input(s): HGBA1C, LABURIC in the last 8760 hours.  Objective:    Physical Exam  Gen: Well-appearing, in no acute distress; non-toxic CV: Well-perfused. Warm.  Resp: Breathing unlabored on room air; no wheezing. Psych: Fluid speech in conversation; appropriate affect; normal thought process  Ortho Exam - Right knee: There is at least a mild to moderate effusion on the right knee.  There is patellofemoral crepitus noted and mild pain with Clark grind test right knee.  Range of motion from 0-130 degrees although pain at endrange flexion.  He does have difficulty with single-leg flexion as well as a positive Thessaly's test over the medial joint line.  Positive medial joint line TTP.  - Right foot/toe: There is pes planus with hyperpronation.  Patient does put pressure throughout the medial column with stance and pushoff phase.  There is tenderness with manipulation of the first MTP joint.  Early varus formation of the toe without frank bunion present.  Imaging: XR Foot Complete Right Result Date: 07/04/2024 Three-view x-ray of the right foot demonstrates first MTP DJD with bony sclerosis and the degree of flattening of the proximal metatarsal head with a mild degree of osteophytosis.  Early varus transition without frank bunion.  *Images personally reviewed from [facility] 03/10/24:   Impression  IMPRESSION: No acute osseous abnormalities.      Electronically Signed by: Irving Salt, MD on 03/11/2024 12:47 PM Narrative  Exam date/time:  03/10/2024 10:11 AM INDICATION: Chronic pain in right foot  M79.671-Chronic pain in right footG89.29-Chronic pain in right foot 43 year old  male TECHNIQUE:  XR FOOT MIN 3 VIEWS RIGHT COMPARISON:  None.      FINDINGS:  The bones of the right foot are normally aligned and intact. Bones are well-mineralized. No sclerotic or lytic lesions. Joint spaces are preserved. Soft tissues are unremarkable. Procedure Note  Salt Irving SQUIBB, MD - 03/11/2024 Formatting of this note might be different from the original. Exam date/time:  03/10/2024 10:11 AM INDICATION: Chronic pain in right foot  M79.671-Chronic pain in right footG89.29-Chronic pain in right foot 43 year old male TECHNIQUE:  XR FOOT MIN 3 VIEWS RIGHT COMPARISON:  None.    FINDINGS:  The bones of the right foot are normally aligned and intact. Bones are well-mineralized. No sclerotic or lytic lesions. Joint spaces are preserved. Soft tissues are unremarkable.   IMPRESSION: No acute osseous abnormalities.  Electronically Signed by: Irving Salt, MD on 03/11/2024 12:47 PM  Exam date/time:  03/10/2024 10:11  AM INDICATION: Chronic pain of right knee  M25.561-Chronic pain of right kneeG89.29-Chronic pain of right knee 43 year old male TECHNIQUE:  XR KNEE 3 VIEWS RIGHT COMPARISON:  None.      FINDINGS: No acute fracture or dislocation. Mild joint space narrowing in the medial compartment with minimal osteophyte formation. Mild genu varus. Bipartite patella. Mild to moderate degenerative changes in the patellofemoral joint with inferior patellar spur. Superior patellar enthesophyte. A fabella is seen posteriorly. Small joint effusion. Infrapatellar soft tissue prominence. Procedure Note  Neysa Irving SQUIBB, MD - 03/11/2024 Formatting of this note might be different from the original. Exam date/time:  03/10/2024 10:11 AM INDICATION: Chronic pain of right knee  M25.561-Chronic pain of right kneeG89.29-Chronic pain of right knee 43 year old male TECHNIQUE:  XR KNEE 3 VIEWS RIGHT COMPARISON:  None.    FINDINGS: No acute fracture or dislocation. Mild joint space narrowing in the  medial compartment with minimal osteophyte formation. Mild genu varus. Bipartite patella. Mild to moderate degenerative changes in the patellofemoral joint with inferior patellar spur. Superior patellar enthesophyte. A fabella is seen posteriorly. Small joint effusion. Infrapatellar soft tissue prominence.   IMPRESSION: No acute osseous abnormalities.  Infrapatellar soft tissue prominence. Consider bursitis.  Mild to moderate patellofemoral DJD. Superior patellar enthesophyte.  Bipartite patella. Small joint effusion.  Mild degenerative changes in the medial compartment.  Narrative & Impression  CLINICAL DATA:  Acute LEFT knee pain. No known injury. Initial encounter.   EXAM: LEFT KNEE - COMPLETE 4+ VIEW   COMPARISON:  None Available.   FINDINGS: No acute fracture, subluxation or dislocation identified.   A small well-corticated bony density along the MEDIAL joint is new since 2013, but does not appear acute.   Mild tricompartmental degenerative changes are noted.   No joint effusion is present.   IMPRESSION: 1. Small well-corticated bony density along the MEDIAL joint, new since 2013, but likely a chronic ossification. Correlate with pain. 2. Mild tricompartmental degenerative changes.     Electronically Signed   By: Reyes Phi M.D.   On: 12/25/2021 13:47    Past Medical/Family/Surgical/Social History: Medications & Allergies reviewed per EMR, new medications updated. 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   Rhabdomyolysis 01/03/2013   GERD (gastroesophageal reflux disease) 06/24/2012   CTS (carpal tunnel syndrome) 02/08/2012   General medical examination 11/10/2011   HTN (hypertension) 11/10/2011   Elevated CK    Snoring 03/11/2010   HEMORRHOIDS, INTERNAL W/O  COMPLICATION 01/14/2007   LIVER FUNCTION TESTS, ABNORMAL 10/29/2006   Past Medical History:  Diagnosis Date   Elevated CK    chronic, ~ 500, saw neuro, muscle Bx 12-2010: negative    GERD (gastroesophageal reflux disease)    Hypertension approx 2005   Rhabdomyolysis 8/03   ck=20,000-unclear etiology   Rhabdomyolysis    Snoring    sleep study 03-2010----> negative for sleep apnea, Rx wt loss     Family History  Problem Relation Age of Onset   Hypertension Mother    Heart attack Father 26   Hypertension Father    Hyperlipidemia Neg Hx    Diabetes Neg Hx    Sudden death Neg Hx    Past Surgical History:  Procedure Laterality Date   CYST EXCISION     2012---Excision of right upper back and left lower back epidermoid  MUSCLE BIOPSY     WISDOM TOOTH EXTRACTION     Social History   Occupational History   Occupation: truck Clinical Research Associate: [employer]  Tobacco Use   Smoking status: Former   Smokeless tobacco: Never  Advertising Account Planner   Vaping status: Never Used  Substance and Sexual Activity   Alcohol use: Yes    Alcohol/week: 0.0 standard drinks of alcohol    Comment: WEEKENDS   Drug use: No    Types: Ketamine, MDMA (Ecstacy)    Comment: 10 years ago   Sexual activity: Not on file

## 2024-07-04 NOTE — Progress Notes (Signed)
 Patient says that he has had knee pain for years, and has been evaluated for it several times in the past. He does have constant pain, and at times his knee will become stiff and lock up, especially in the middle of the night. He does use ice and heat for the knee, but otherwise does not do anything to treat it. He played sports growing up, and while he does not remember a significant injury, he did have pain when playing, especially basketball. He says that as a kid he would jump off of the roof of their one story house, which could be contributing to his pain, although he never had a significant injury at that time, either.  Patient has had pain over the medial aspect of the right big toe for about 2 months. He says that his work boots are less painful, but his more comfortable and flexible shoes are more painful. He does have pain with AROM. He has not done anything at home to treat this pain specifically.

## 2024-07-08 ENCOUNTER — Telehealth: Payer: Self-pay | Admitting: Sports Medicine

## 2024-07-08 ENCOUNTER — Other Ambulatory Visit: Payer: Self-pay | Admitting: Sports Medicine

## 2024-07-08 MED ORDER — DIAZEPAM 5 MG PO TABS: ORAL_TABLET | ORAL | 0 refills | Status: AC

## 2024-07-08 NOTE — Telephone Encounter (Signed)
 Pt called wanting to know if he can get some anxiety medicine for his MRI next week. Pharmacy is [facility] [address]. Call back number is [phone]

## 2024-07-08 NOTE — Telephone Encounter (Signed)
 Called patient and let him know prescription was sent to pharmacy. Also told him that referral was sent to [facility] for orthotics so he should get a call to schedule. He will let us  know if he does not hear from them.

## 2024-07-08 NOTE — Telephone Encounter (Signed)
 Pt also just told me that he wants to get fit for orthotic inserts. Call back number is [phone].

## 2024-07-15 ENCOUNTER — Ambulatory Visit
Admission: RE | Admit: 2024-07-15 | Discharge: 2024-07-15 | Disposition: A | Discharge status: Home / Self Care | Source: Ambulatory Visit | Attending: Sports Medicine | Admitting: Sports Medicine

## 2024-07-15 DIAGNOSIS — G8929 Other chronic pain: Secondary | ICD-10-CM

## 2024-07-15 DIAGNOSIS — M25561 Pain in right knee: Secondary | ICD-10-CM

## 2024-07-15 DIAGNOSIS — M25461 Effusion, right knee: Secondary | ICD-10-CM

## 2024-07-22 ENCOUNTER — Telehealth: Payer: Self-pay | Admitting: Sports Medicine

## 2024-07-22 NOTE — Telephone Encounter (Signed)
 Patient called. Says he did not get a call about the orthotic.

## 2024-07-24 NOTE — ED Provider Notes (Signed)
 " [facility] EMERGENCY DEPARTMENT  ED Provider Note History   Chief Complaint  Patient presents with   Knee Injury   History of Present Illness Benjamin Warner is a 44 y.o. male who presents to the ED for evaluation of left knee pain.  Patient presents tonight via EMS.  Patient shares that he hit his knee on a metal bar getting out of a truck.  Patient reports he is unable to bear weight on the left leg.  Patient denies any additional injury.    Level of Interpreter Services: No interpreter needed (no language barrier) Past Medical History:  Diagnosis Date   Hypertension    Rhabdomyolysis    History reviewed. No pertinent surgical history. History reviewed. No pertinent family history. Social History   Socioeconomic History   Marital status: Single  Tobacco Use   Smoking status: Unknown   Review of Systems: All other systems negative except as noted in HPI.   Physical Exam  BP (!) 152/93 (BP Location: Right upper arm)   Pulse 89   Temp 36.9 C (98.4 F) (Oral)   Resp 17   SpO2 97%  Physical Exam Vitals and nursing note reviewed.  Constitutional:      General: He is not in acute distress.    Appearance: Normal appearance. He is well-developed. He is not diaphoretic.  Cardiovascular:     Rate and Rhythm: Normal rate and regular rhythm.     Pulses: Normal pulses.     Heart sounds: Normal heart sounds.  Pulmonary:     Effort: Pulmonary effort is normal.     Breath sounds: Normal breath sounds.  Musculoskeletal:     Cervical back: Full passive range of motion without pain and neck supple. No pain with movement, spinous process tenderness or muscular tenderness.     Left knee: Bony tenderness present. Decreased range of motion.     Comments: Patient with diffuse tenderness of the left leg which is warm and well-perfused.  Patient is unable to hold the knee at full extension against resistance.  Additionally patient is unable to extend the knee when asked.    Skin:     General: Skin is warm and dry.  Neurological:     Mental Status: He is alert and oriented to person, place, and time.  Psychiatric:        Behavior: Behavior normal.      Procedures  Procedures TIP (no need to delete)  Delete the Procedures section if patient does not have a procedure.  Medical Decision Making and ED Course  Given the patient's initial exam and evaluation, the following diagnostic evaluation has been ordered. The patient will be placed in the appropriate treatment space, once one is available to complete the evaluation and treatment. I have discussed the plan of care with the patient/representative and have advised that the plan of care may change based upon the results of diagnostic testing. The patient/representative has been asked to not leave prior to the completion of their evaluation. I have advised that if they leave prior to the completion of their evaluation, they will be leaving against medical advice.  44 year old male presents with left knee injury.  Patient describes hitting his knee on a metal bar of his truck.  Patient shares he is unable to bear weight on the knee.  Initial radiographs were obtained.  Patient does appear to have a somewhat high riding patella, no fractures dislocations.  Clinical concern for possible patella tendon rupture quadricep muscle tear given  patient's inability to extend the knee or hold it against resistance.  Will send patient for CT of the knee, although this is less than optimal study, MRI study is not available at this time.  CT scan is mostly reassuring, soft tissue injury.    Will place patient into a knee immobilizer, provide limited analgesia, provide crutches, encourage patient to reach out to orthopedics for additional care and evaluation, will also submit an ambulatory referral to orthopedics to help facilitate that care.    Medical Complexity:  [x] New and requires workup. [] New and does not require  workup. [x] Pertinent labs & imaging results were reviewed by me and considered in my decision making. [] I obtained history from someone other than the patient. [] I reviewed previous medical records. [x] I independently visualized image(s), tracing(s), and/or specimen(s). [] I discussed the patient with another provider.     Results: LABS: No results found for this or any previous visit (from the past 24 hours).  IMAGING: Notified that Radiology will read radiologic studies and patient will be called if change in report by radiology department. X-ray knee left 1 to 2 views    (Results Pending)   My imaging read: X-ray knee left 2 views my read, no fractures dislocations, arthritis, clinically high riding patella.  CT LEFT KNEE WITHOUT    IMPRESSION: Prepatellar soft tissue swelling. Left knee joint effusion. Mild knee joint degenerative changes. No acute fracture or dislocation. MRI is often useful in the evaluation of suspected internal derangement.  Tanda Clarke, MD This report has been electronically signed and verified by the Radiologist whose name is printed above.      ED Clinical Impression  1. Left anterior knee pain   2. Blunt trauma               This note was partially written using Dragon (a voice recognition system) and may contain typographical errors missed during proofreading. TIP (no need to delete)  Click Refresh button to add wrap-up information to note.    I personally evaluated and took responsibility for care of this patient. The physician supervisor was Rockey Gearing, MD who was available for consult and attested to or co-signed this chart. The service was performed non-incident to a physician.     Stuart Berg T, [place] 07/24/24 [phone]  "

## 2024-07-28 ENCOUNTER — Encounter: Admitting: Family Medicine

## 2024-07-31 ENCOUNTER — Encounter: Admitting: Family Medicine

## 2024-08-04 ENCOUNTER — Ambulatory Visit: Admitting: Sports Medicine

## 2024-08-05 ENCOUNTER — Ambulatory Visit: Admitting: Orthopedic Surgery

## 2024-08-05 ENCOUNTER — Encounter: Payer: Self-pay | Admitting: Sports Medicine

## 2024-08-05 ENCOUNTER — Ambulatory Visit: Admitting: Sports Medicine

## 2024-08-05 DIAGNOSIS — M23203 Derangement of unspecified medial meniscus due to old tear or injury, right knee: Secondary | ICD-10-CM | POA: Diagnosis not present

## 2024-08-05 DIAGNOSIS — M1711 Unilateral primary osteoarthritis, right knee: Secondary | ICD-10-CM | POA: Diagnosis not present

## 2024-08-05 DIAGNOSIS — M7150 Other bursitis, not elsewhere classified, unspecified site: Secondary | ICD-10-CM

## 2024-08-05 DIAGNOSIS — S8992XA Unspecified injury of left lower leg, initial encounter: Secondary | ICD-10-CM | POA: Diagnosis not present

## 2024-08-05 MED ORDER — METHYLPREDNISOLONE ACETATE 40 MG/ML IJ SUSP
80.0000 mg | INTRAMUSCULAR | Status: AC | PRN
  Administered 2024-08-05: 80 mg via INTRA_ARTICULAR

## 2024-08-05 MED ORDER — BUPIVACAINE HCL 0.25 % IJ SOLN
2.0000 mL | INTRAMUSCULAR | Status: AC | PRN
  Administered 2024-08-05: 2 mL via INTRA_ARTICULAR

## 2024-08-05 MED ORDER — LIDOCAINE HCL 1 % IJ SOLN
2.0000 mL | INTRAMUSCULAR | Status: AC | PRN
  Administered 2024-08-05: 2 mL

## 2024-08-05 NOTE — Progress Notes (Signed)
 "  Benjamin Warner - 44 y.o. male MRN [id number]  Date of birth: 09-04-1980  Office Visit Note: Visit Date: 08/05/2024 [facility]CP: [facility] Referred by: [facility]  Subjective: Chief Complaint  Patient presents with   Right Knee - Follow-up   HPI: Benjamin Warner is a pleasant 44 y.o. male who presents today for f/u of right knee pain, MRI review.  New injury to left knee x 2 weeks ago. He says that he hit it on a metal rod when getting out of the truck, and has been unable to bend the knee or lift the leg since then. He does have significant swelling in the left knee. Patient had an MRI done this morning of the left knee (through [facility])  Discussed the use of AI scribe software for clinical note transcription with the patient, who gave verbal consent to proceed.  History of Present Illness Benjamin Warner is a 44 year old male with chronic right knee degenerative meniscus tear who presents with bilateral knee pain and dysfunction.  He acutely injured his left knee at work by striking it against a metal bar while exiting his truck and has had significant swelling and pain with inability to lift or bend the leg. He cannot lift the leg from seated or supine positions and the knee feels tight with flexion, with gradual improvement. He still has difficulty with sitting and standing, especially in the morning. Daily range of motion exercises, including heel slides and extension work, have provided some benefit. He has not had injections or procedures for the left knee.  He has chronic right knee pain with mechanical symptoms of clicking, popping, swelling, and occasional instability. Prior MRI showed a degenerative meniscus tear with early arthritic changes and a congenital bipartite patella. He has recurrent right knee swelling and pain without prior surgery or injections and prefers non-surgical management due to concern about accelerating arthritis with surgery.  Pertinent  ROS were reviewed with the patient and found to be negative unless otherwise specified above in HPI.   Assessment & Plan: Visit Diagnoses:  1. Unilateral primary osteoarthritis, right knee   2. Degenerative tear of medial meniscus of right knee   3. Injury of left knee, initial encounter   4. Traumatic bursitis     Assessment and Plan Summary:  - Right knee -moderate cartilage loss with mild to moderate OA and underlying degenerative meniscal tear.  No role for meniscal repair.  Trial of injection, therapy.  Last resort would be meniscectomy. Assessment & Plan Right knee degenerative meniscus tear with osteoarthritis and effusion Chronic degenerative meniscus tear with early osteoarthritis and effusion. Tear is diffuse, degenerative, with extrusion and loss of meniscal cushion causing pain, swelling, mechanical symptoms, and occasional instability. Not suitable for repair; meniscectomy possible but may worsen osteoarthritis. Initial non-surgical management preferred. - Performed intra-articular knee injection for pain and inflammation control. -May use ice/heat and/or Tylenol  as needed over the next few days for postinjection pain - Diclofenac  50mg  1-2x daily as needed for knee OA/joint pains - Recommended home-based therapy regimen focused on knee mobility and strengthening exercises, including heel slides and assisted range of motion.  We did print out a customized handout and my athletic trainer did review these exercises with him in the room.  Will begin unilateral exercises after 48 to 72 hours from injection, may increase to bilateral once his contralateral knee is improving.  Recommend continuing these on a 3-4 times a week basis for maintenance and  prevention. - Advised trial of conservative management for 6-8 weeks to assess improvement. - Discussed conditional plan for surgical intervention (meniscectomy) if symptoms persist despite conservative management. - Instructed him to follow  up as needed based on symptom progression and response to therapy.  Left knee injury s/p fall --> prepatellar bursitis with quadriceps and patellar tendinosis (likely hematoma-in nature) Acute prepatellar bursitis with fluid collection, likely hematoma, post-trauma. MRI shows quadriceps and patellar tendinosis without tearing. Conservative management appropriate due to absence of tendon rupture and symptom improvement. - Reviewed MRI findings with him. - Recommended daily range of motion exercises, including heel slides and assisted knee extension to prevent stiffness and promote recovery. - Advised follow-up with the ordering provider for ongoing management and monitoring for any changes or worsening symptoms. - Offered to provide follow-up care if needed, especially if there are issues with workers' compensation or dissatisfaction with outside care. - Additional considerations: Formal PT for Left knee; Consider US  +/- aspiration of patellar bursitis/hematoma to improve ROM/motion  Follow-up: Return if symptoms worsen or fail to improve.   Meds & Orders: No orders of the defined types were placed in this encounter.   Orders Placed This Encounter  Procedures   Large Joint Inj     Procedures: Large Joint Inj: R knee on 08/05/2024 5:55 PM Indications: pain Details: 22 G 1.5 in needle, anterolateral approach Medications: 2 mL lidocaine  1 %; 2 mL bupivacaine  0.25 %; 80 mg methylPREDNISolone  acetate 40 MG/ML Outcome: tolerated well, no immediate complications  Knee Injection, Right: After discussion on risks/benefits/indications, informed verbal consent was obtained and a timeout was performed, patient was seated on exam table. The patient's knee was prepped with Betadine and alcohol swab and utilizing anterolateral approach, the patient's knee was injected intraarticularly with 2:2:2 lidocaine  1%:bupivicaine 0.25%:depomedrol. Patient tolerated the procedure well without immediate  complications.  Procedure, treatment alternatives, risks and benefits explained, specific risks discussed. Consent was given by the patient. Patient was prepped and draped in the usual sterile fashion.          Clinical History: No specialty comments available.  He reports that he has quit smoking. He has never used smokeless tobacco. No results for input(s): HGBA1C, LABURIC in the last 8760 hours.  Objective:    Physical Exam  Gen: Well-appearing, in no acute distress; non-toxic CV: Well-perfused. Warm.  Resp: Breathing unlabored on room air; no wheezing. Psych: Fluid speech in conversation; appropriate affect; normal thought process  *MSK/Ortho Exam: Physical Exam MUSCULOSKELETAL:   Right knee: There is minimal to mild effusion of the right knee.  Positive TTP over the medial joint line with + McMurray testing. Good strength preservation  Left knee: There is notable swelling over the anterior aspect of the knee joint near the prepatellar region.  He is able to perform Ganger flexion and extension although difficulty with straight leg raise, unable to take him passively however into extension. + Antalgic gait.  Imaging:  Left knee MRI 08/04/24 -I did visualize report as well as the actual images itself, upon my review demonstrated:  rather notable prepatellar bursitis with fluid collection, patellofemoral chondrosis, edema over the medial femoral condyle, quadriceps and patellar tendinosis, and edema in the suprapatellar and infrapatellar fat pads. Mild joint effusion, but moreso in prepatellar pouch.   MR Knee Right w/o contrast CLINICAL DATA:  Chronic knee pain and effusion. Patellofemoral arthralgia.  EXAM: MR KNEE*R* W/O CM  TECHNIQUE: Multiplanar, multisequence MR imaging of the knee was performed. No intravenous contrast was administered.  COMPARISON:  None recent.  Radiographs 11/27/2011.  FINDINGS: MENISCI  Medial meniscus: Diffuse peripheral degenerative  tearing of the posterior horn and body. The meniscus is partially extruded peripherally from the joint. The meniscal root appears intact, and no centrally displaced meniscal fragments are identified.  Lateral meniscus:  Intact with normal morphology.  LIGAMENTS  Cruciates: The anterior and posterior cruciate ligaments are intact.  Collaterals: The medial and lateral collateral ligament complexes are intact. There is fluid within the pes anserine bursa.  CARTILAGE  Patellofemoral: Moderate patellofemoral degenerative changes with patellar chondral thinning, surface irregularity and subchondral cyst formation, greatest in the lateral facet. Underlying chronic fragmentation of the superolateral aspect of the patella consistent with bipartite patella.  Medial: Moderate chondral thinning, surface irregularity and peripheral osteophyte formation. There is subchondral cyst formation posteriorly in the nonweightbearing aspect of the medial femoral condyle.  Lateral:  Mild chondral thinning and surface irregularity.  MISCELLANEOUS  Joint: Small joint effusion with irregular synovial thickening in the suprapatellar bursa.  Popliteal Fossa: The popliteus muscle and tendon are intact. No significant Baker's cyst.  Extensor Mechanism: As above, bipartite patella with associated patella alta. There is edema superolaterally in Hoffa's fat.The patellar retinacula and medial patellofemoral ligament are intact.  Bones: No evidence of acute fracture or dislocation. Bipartite patella.  Other: No other significant periarticular soft tissue findings.  IMPRESSION: 1. Tricompartmental degenerative changes, most advanced in the medial and patellofemoral compartments. No acute osseous findings. 2. Diffuse peripheral degenerative tearing of the posterior horn and body of the medial meniscus. 3. The lateral meniscus, cruciate and collateral ligaments are intact. 4. Bipartite patella with  associated patella alta and edema superolaterally in Hoffa's fat. 5. Small joint effusion with irregular synovial thickening in the suprapatellar bursa.  Electronically Signed   By: Benjamin Warner M.D.   On: 08/01/2024 15:56   Past Medical/Family/Surgical/Social History: Medications & Allergies reviewed per EMR, new medications updated. 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   Rhabdomyolysis 01/03/2013   GERD (gastroesophageal reflux disease) 06/24/2012   CTS (carpal tunnel syndrome) 02/08/2012   General medical examination 11/10/2011   HTN (hypertension) 11/10/2011   Elevated CK    Snoring 03/11/2010   HEMORRHOIDS, INTERNAL W/O COMPLICATION 01/14/2007   LIVER FUNCTION TESTS, ABNORMAL 10/29/2006   Past Medical History:  Diagnosis Date   Elevated CK    chronic, ~ 500, saw neuro, muscle Bx 12-2010: negative    GERD (gastroesophageal reflux disease)    Hypertension approx 2005   Rhabdomyolysis 8/03   ck=20,000-unclear etiology   Rhabdomyolysis    Snoring    sleep study 03-2010----> negative for sleep apnea, Rx wt loss     Family History  Problem Relation Age of Onset   Hypertension Mother    Heart attack Father 69   Hypertension Father    Hyperlipidemia Neg Hx    Diabetes Neg Hx    Sudden death Neg Hx    Past Surgical History:  Procedure Laterality Date   CYST EXCISION     2012---Excision of right upper back and left lower back epidermoid     MUSCLE BIOPSY     WISDOM TOOTH EXTRACTION     Social History   Occupational History   Occupation: truck Clinical Research Associate: [employer]  Tobacco Use  Smoking status: Former   Smokeless tobacco: Never  Vaping Use   Vaping status: Never Used  Substance and Sexual Activity   Alcohol use: Yes    Alcohol/week: 0.0 standard  drinks of alcohol    Comment: WEEKENDS   Drug use: No    Types: Ketamine, MDMA (Ecstacy)    Comment: 10 years ago   Sexual activity: Not on file   Patient was instructed in 10 minutes of therapeutic exercises for Right knee to improve strength, ROM and function according to my instructions and plan of care by a Certified Athletic Trainer during the office visit. A customized handout was provided and demonstration of proper technique shown and discussed. Patient did perform exercises and demonstrate understanding through teachback.  All questions discussed and answered.  Lonell Sprang, DO Primary Care Sports Medicine Physician  [facility]  This note was dictated using Dragon naturally speaking software and may contain errors in syntax, spelling, or content which have not been identified prior to signing this note.    "

## 2024-08-05 NOTE — Progress Notes (Signed)
 Patient is here for MRI review of his right knee today. He says that the right knee feels the same as it did at his last visit.   Patient did hurt his left knee at work about 2 weeks ago. He says that he hit it on a metal rod when getting out of the truck, and has been unable to bend the knee or lift the leg since then. He does have significant swelling in the left knee. Patient had an MRI done this morning of the left knee, and is asking whether that could be discussed today, as well. He is okay with a future appointment to discuss the left knee, if needed.

## 2024-08-07 ENCOUNTER — Ambulatory Visit: Admitting: Orthopedic Surgery
